# Patient Record
Sex: Male | Born: 1985 | Race: Black or African American | Hispanic: No | Marital: Single | Smoking: Never smoker
Health system: Southern US, Community
[De-identification: ages and names within clinical notes are randomized; demographics above are authoritative.]

## PROBLEM LIST (undated history)

## (undated) HISTORY — PX: OTHER SURGICAL HISTORY: SHX169

---

## 2004-11-15 ENCOUNTER — Emergency Department: Payer: Self-pay | Admitting: Emergency Medicine

## 2007-02-21 ENCOUNTER — Emergency Department: Payer: Self-pay | Admitting: Emergency Medicine

## 2007-12-23 ENCOUNTER — Emergency Department: Payer: Self-pay | Admitting: Internal Medicine

## 2009-08-21 ENCOUNTER — Emergency Department: Payer: Self-pay | Admitting: Unknown Physician Specialty

## 2009-08-29 ENCOUNTER — Emergency Department: Payer: Self-pay | Admitting: Emergency Medicine

## 2016-07-09 ENCOUNTER — Ambulatory Visit (INDEPENDENT_AMBULATORY_CARE_PROVIDER_SITE_OTHER): Payer: 59

## 2016-07-09 ENCOUNTER — Ambulatory Visit
Admission: EM | Admit: 2016-07-09 | Discharge: 2016-07-09 | Disposition: A | Discharge status: Home / Self Care | Payer: 59 | Attending: Family Medicine | Admitting: Family Medicine

## 2016-07-09 ENCOUNTER — Encounter: Payer: Self-pay | Admitting: Gynecology

## 2016-07-09 DIAGNOSIS — S86012A Strain of left Achilles tendon, initial encounter: Secondary | ICD-10-CM | POA: Diagnosis not present

## 2016-07-09 MED ORDER — MELOXICAM 15 MG PO TABS: 15.0000 mg | ORAL_TABLET | Freq: Every day | ORAL | 0 refills | Status: DC

## 2016-07-09 NOTE — ED Provider Notes (Signed)
[facility]    CSN: [id number] Arrival date & time: 07/09/16  0805     History   Chief Complaint Chief Complaint  Patient presents with  . Ankle Pain    HPI Peter Arias is a 30 y.o. male.   Patient reports working out in the gym yesterday boxing. States the gym floor was an even a padded and he felt Compazine back. Since then he's had pain in his left ankle. He was worse yesterday today's axial not as bad. No previous injury to the ankle or joint injury. He denies being on any medication or medical problems. No previous surgeries. He does not smoke. No known drug allergies. No pertinent family medical history pertaining to today's visit.   The history is provided by the patient. No language interpreter was used.  Ankle Pain  Location:  Ankle Injury: yes   Mechanism of injury comment:  M9ovement injury Ankle location:  L ankle Pain details:    Quality:  Aching, burning, pressure and shooting   Radiates to:  Does not radiate   Severity:  Moderate   Onset quality:  Sudden   Duration:  1 day   Timing:  Constant   Progression:  Worsening Chronicity:  New Dislocation: no   Foreign body present:  No foreign bodies Prior injury to area:  No Relieved by:  Nothing Worsened by:  Bearing weight and activity Ineffective treatments:  None tried Associated symptoms: swelling and tingling   Risk factors: no concern for non-accidental trauma, no frequent fractures, no known bone disorder, no obesity and no recent illness     History reviewed. No pertinent past medical history.  There are no active problems to display for this patient.   History reviewed. No pertinent surgical history.     Home Medications    Prior to Admission medications   Medication Sig Start Date End Date Taking? Authorizing Provider  meloxicam (MOBIC) 15 MG tablet Take 1 tablet (15 mg total) by mouth daily. 07/09/16   Hassan RowanEugene Tyrina Hines, MD    Family History No family history on  file.  Social History Social History  Substance Use Topics  . Smoking status: Never Smoker  . Smokeless tobacco: Never Used  . Alcohol use Yes     Allergies   Patient has no known allergies.   Review of Systems Review of Systems  Musculoskeletal: Positive for arthralgias, joint swelling and myalgias.  All other systems reviewed and are negative.    Physical Exam Triage Vital Signs ED Triage Vitals  Enc Vitals Group     BP 07/09/16 0831 (!) 158/86     Pulse Rate 07/09/16 0831 68     Resp --      Temp 07/09/16 0831 97 F (36.1 C)     Temp Source 07/09/16 0831 Tympanic     SpO2 07/09/16 0831 100 %     Weight 07/09/16 0832 170 lb (77.1 kg)     Height 07/09/16 0831 1' (0.305 m)     Head Circumference --      Peak Flow --      Pain Score 07/09/16 0833 5     Pain Loc --      Pain Edu? --      Excl. in GC? --    No data found.   Updated Vital Signs BP (!) 158/86 (BP Location: Left Arm)   Pulse 68   Temp 97 F (36.1 C) (Tympanic)   Ht 6\' 1"  (1.854 m)  Wt 170 lb (77.1 kg)   SpO2 100%   BMI 22.43 kg/m   Visual Acuity Right Eye Distance:   Left Eye Distance:   Bilateral Distance:    Right Eye Near:   Left Eye Near:    Bilateral Near:     Physical Exam  Constitutional: He is oriented to person, place, and time. He appears well-developed and well-nourished.  HENT:  Head: Normocephalic and atraumatic.  Eyes: Pupils are equal, round, and reactive to light.  Neck: Normal range of motion.  Musculoskeletal: He exhibits tenderness. He exhibits no edema.       Left foot: There is tenderness. There is normal range of motion.       Feet:  Pulses intact there is tenderness over the Achilles tendon consistent with a partial Achilles tendon tear.  Neurological: He is alert and oriented to person, place, and time.  Skin: Skin is warm.  Psychiatric: He has a normal mood and affect.  Vitals reviewed.    UC Treatments / Results  Labs (all labs ordered are  listed, but only abnormal results are displayed) Labs Reviewed - No data to display  EKG  EKG Interpretation None       Radiology Dg Ankle Complete Left  Result Date: 07/09/2016 CLINICAL DATA:  Posterior left ankle pain.  Leg buckled yesterday EXAM: LEFT ANKLE COMPLETE - 3+ VIEW COMPARISON:  None. FINDINGS: There is no evidence of fracture, dislocation, or joint effusion. There is no evidence of arthropathy or other focal bone abnormality. Soft tissues are unremarkable. IMPRESSION: Negative. Electronically Signed   By: Charlett NoseKevin  Dover M.D.   On: 07/09/2016 09:47    Procedures Procedures (including critical care time)  Medications Ordered in UC Medications - No data to display   Initial Impression / Assessment and Plan / UC Course  I have reviewed the triage vital signs and the nursing notes.  Pertinent labs & imaging results that were available during my care of the patient were reviewed by me and considered in my medical decision making (see chart for details).  Clinical Course    Explained to the patient on a Sunday afternoon is not much we can do as far as diagnostic study. Obtain x-ray just to make sure this no fracture but am strongly suggesting referral to orthopedic for reevaluation ASAP slowly can assess how much damage was done to the Achilles tendon. Also explained to him that MRI may be needed to ascertain how much damage was done as well. I strongly suggest that he try to see orthopedic on Monday and that we'll give him information for orthopedic office but if there is trouble getting into the orthopedic office on Monday he can call us back and we'll try to schedule appointment with somebody very soon. In the meantime we'll place him in a Cam Walker Mobic 15 mg 1 tablet a day and work note for Monday and Tuesday. Final Clinical Impressions(s) / UC Diagnoses   Final diagnoses:  Partial Achilles tendon tear, left, initial encounter    New Prescriptions Discharge  Medication List as of 07/09/2016  9:32 AM    START taking these medications   Details  meloxicam (MOBIC) 15 MG tablet Take 1 tablet (15 mg total) by mouth daily., Starting Sun 07/09/2016, Normal         Note: This dictation was prepared with Dragon dictation along with smaller phrase technology. Any transcriptional errors that result from this process are unintentional.   Hassan RowanEugene Jernee Murtaugh, MD 07/09/16 1009

## 2016-07-09 NOTE — ED Triage Notes (Signed)
Patient stated while at gym x yesterday injury left ankle.

## 2016-07-09 NOTE — ED Triage Notes (Signed)
Cam boot applied to left leg. Instruction given to patient

## 2016-07-19 ENCOUNTER — Encounter: Payer: Self-pay | Admitting: *Deleted

## 2016-07-19 ENCOUNTER — Ambulatory Visit
Admission: EM | Admit: 2016-07-19 | Discharge: 2016-07-19 | Disposition: A | Discharge status: Home / Self Care | Payer: 59 | Attending: Family Medicine | Admitting: Family Medicine

## 2016-07-19 DIAGNOSIS — S86002S Unspecified injury of left Achilles tendon, sequela: Secondary | ICD-10-CM | POA: Diagnosis not present

## 2016-07-19 MED ORDER — HYDROCODONE-ACETAMINOPHEN 7.5-300 MG PO TABS: 1.0000 | ORAL_TABLET | Freq: Three times a day (TID) | ORAL | 0 refills | Status: DC | PRN

## 2016-07-19 NOTE — ED Triage Notes (Signed)
Pt seen here on 11/19 and dx with partial tear of left achilles tendon. Returns today as left foot is swollen and painful. Has MRI scheduled for Friday.

## 2016-07-19 NOTE — ED Provider Notes (Signed)
[facility]    CSN: [id number] Arrival date & time: 07/19/16  [id number]     History   Chief Complaint Chief Complaint  Patient presents with  . Follow-up    HPI Peter Arias is a 30 y.o. male.   Peter Arias is a 30 year old black male who was seen on the 19th of this month and diagnosed with a partial Achilles tendon tear referred to the orthopedic of his choice. He states that he decided to see a sports specialist but could not see the person for 10 days from the time that he was seen here. He states that they assured him that they can get a MRI on the same day which struck me as being unusual he states that he was doing fine until about 2 days ago where he started having increased swelling of his left foot. He does have a Cam Walker on. He is going to work and states the last 2 days especially last night he's walked excessively at [employer]. He states that the meloxicam is longer controlling his pain he wants something stronger for his pain and he wants a note restricting his activity at lab [employer]. I offered to take him out of work for the next 2 days until he sees the orthopedic specialist on Friday but he states that all he needs is a note from me restricting his activity. They see previous notes for past medical history essentially unremarkable.   The history is provided by the patient. No language interpreter was used.    History reviewed. No pertinent past medical history.  There are no active problems to display for this patient.   History reviewed. No pertinent surgical history.     Home Medications    Prior to Admission medications   Medication Sig Start Date End Date Taking? Authorizing Provider  meloxicam (MOBIC) 15 MG tablet Take 1 tablet (15 mg total) by mouth daily. 07/09/16  Yes Hassan RowanEugene Rudolph Daoust, MD  Hydrocodone-Acetaminophen 7.5-300 MG TABS Take 1 tablet by mouth 3 (three) times daily as needed. 07/19/16   Hassan RowanEugene Joevanni Roddey, MD    Family History History reviewed.  No pertinent family history.  Social History Social History  Substance Use Topics  . Smoking status: Never Smoker  . Smokeless tobacco: Never Used  . Alcohol use No     Allergies   Patient has no known allergies.   Review of Systems Review of Systems  Musculoskeletal: Positive for gait problem and myalgias.  All other systems reviewed and are negative.    Physical Exam Triage Vital Signs ED Triage Vitals  Enc Vitals Group     BP 07/19/16 0852 133/77     Pulse Rate 07/19/16 0852 68     Resp 07/19/16 0852 16     Temp 07/19/16 0852 97.5 F (36.4 C)     Temp Source 07/19/16 0852 Oral     SpO2 07/19/16 0852 100 %     Weight 07/19/16 0854 170 lb (77.1 kg)     Height 07/19/16 0854 6\' 1"  (1.854 m)     Head Circumference --      Peak Flow --      Pain Score 07/19/16 1014 5     Pain Loc --      Pain Edu? --      Excl. in GC? --    No data found.   Updated Vital Signs BP 133/77 (BP Location: Left Arm)   Pulse 68   Temp 97.5 F (  36.4 C) (Oral)   Resp 16   Ht 6\' 1"  (1.854 m)   Wt 170 lb (77.1 kg)   SpO2 100%   BMI 22.43 kg/m   Visual Acuity Right Eye Distance:   Left Eye Distance:   Bilateral Distance:    Right Eye Near:   Left Eye Near:    Bilateral Near:     Physical Exam  Constitutional: He is oriented to person, place, and time. He appears well-developed and well-nourished.  HENT:  Head: Normocephalic.  Eyes: Pupils are equal, round, and reactive to light.  Neck: Normal range of motion.  Pulmonary/Chest: Effort normal.  Musculoskeletal: He exhibits edema and tenderness.       Left foot: There is tenderness and swelling.       Feet:  Patient deaf and has more swelling over the foot and tenderness over the left ankle. He has more ecchymosis as well than he did on the 19th.  Neurological: He is alert and oriented to person, place, and time.  Skin: Skin is warm.     UC Treatments / Results  Labs (all labs ordered are listed, but only abnormal  results are displayed) Labs Reviewed - No data to display  EKG  EKG Interpretation None       Radiology No results found.  Procedures Procedures (including critical care time)  Medications Ordered in UC Medications - No data to display   Initial Impression / Assessment and Plan / UC Course  I have reviewed the triage vital signs and the nursing notes.  Pertinent labs & imaging results that were available during my care of the patient were reviewed by me and considered in my medical decision making (see chart for details).  Clinical Course     Several concerns. Through one incision orthopedically for 10 days since she has an appointment in 2 days with much we can do at this time. If they can get the MRI of the same day great but am somewhat doubtful. Other concern that I have is that most places as explained to him if injury is not done at work not going to accept restrictions. They're going to insist that either he is able to go to work or not go to work. He insisted they will honor of restricted note and I'll recommend that he can sit for most of his shift. For the pain medication he was looked up the drug reporting site and no consistent use of Vicodin but he has used 7.5 for the past and will give him basically 5 days worth #15 one every 8 hours and I recommend icing the foot as much as possible elevate the foot as staying off the foot as much as possible until he sees the orthopedic.  Final Clinical Impressions(s) / UC Diagnoses   Final diagnoses:  Achilles tendon injury, left, sequela    New Prescriptions Discharge Medication List as of 07/19/2016  9:53 AM    START taking these medications   Details  Hydrocodone-Acetaminophen 7.5-300 MG TABS Take 1 tablet by mouth 3 (three) times daily as needed., Starting Wed 07/19/2016, Normal        Given to him as well.  Note: This dictation was prepared with Dragon dictation along with smaller phrase technology. Any  transcriptional errors that result from this process are unintentional.   Hassan RowanEugene Aiyah Scarpelli, MD 07/19/16 1047

## 2016-08-10 ENCOUNTER — Ambulatory Visit: Payer: 59

## 2017-10-27 ENCOUNTER — Ambulatory Visit
Admission: EM | Admit: 2017-10-27 | Discharge: 2017-10-27 | Disposition: A | Discharge status: Home / Self Care | Payer: 59 | Attending: Family Medicine | Admitting: Family Medicine

## 2017-10-27 DIAGNOSIS — S0081XA Abrasion of other part of head, initial encounter: Secondary | ICD-10-CM

## 2017-10-27 DIAGNOSIS — M25562 Pain in left knee: Secondary | ICD-10-CM

## 2017-10-27 DIAGNOSIS — M25561 Pain in right knee: Secondary | ICD-10-CM

## 2017-10-27 MED ORDER — IBUPROFEN 800 MG PO TABS: 800.0000 mg | ORAL_TABLET | Freq: Three times a day (TID) | ORAL | 0 refills | Status: DC | PRN
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
## 2017-10-27 NOTE — Discharge Instructions (Signed)
Take it easy and rest.  Ibuprofen as directed.  You're going to be fine.  Take care  Dr. Adriana Simasook

## 2017-10-30 ENCOUNTER — Encounter: Payer: Self-pay | Admitting: Nurse Practitioner

## 2017-12-21 ENCOUNTER — Encounter: Payer: Self-pay | Admitting: Nurse Practitioner

## 2017-12-21 ENCOUNTER — Ambulatory Visit: Payer: BLUE CROSS/BLUE SHIELD | Admitting: Nurse Practitioner

## 2017-12-21 VITALS — BP 126/81 | HR 64 | Resp 16 | Ht 73.0 in | Wt 164.0 lb

## 2017-12-21 DIAGNOSIS — R300 Dysuria: Secondary | ICD-10-CM

## 2017-12-21 DIAGNOSIS — Z0001 Encounter for general adult medical examination with abnormal findings: Secondary | ICD-10-CM | POA: Diagnosis not present

## 2017-12-21 DIAGNOSIS — Z0000 Encounter for general adult medical examination without abnormal findings: Secondary | ICD-10-CM

## 2017-12-21 NOTE — Addendum Note (Signed)
Addended by: Loura Back on: 12/21/2017 04:38 PM   Modules accepted: Orders

## 2017-12-21 NOTE — Addendum Note (Signed)
Addended by: Golda Acre on: 12/21/2017 04:35 PM   Modules accepted: Orders

## 2017-12-21 NOTE — Progress Notes (Signed)
[facility] [address] [place], [place] [address]  Internal MEDICINE  Office Visit Note  Patient Name: Peter Arias  [id number]  [id number]  Date of Service: 12/21/2017  Chief Complaint  Patient presents with  . Annual Exam     The patient is here for routine wellness visit. He has no concerns or complaints today.     Current Medication: Outpatient Encounter Medications as of 12/21/2017  Medication Sig  . [DISCONTINUED] ibuprofen (ADVIL,MOTRIN) 800 MG tablet Take 1 tablet (800 mg total) by mouth every 8 (eight) hours as needed. (Patient not taking: Reported on 12/21/2017)   No facility-administered encounter medications on file as of 12/21/2017.     Surgical History: History reviewed. No pertinent surgical history.  Medical History: History reviewed. No pertinent past medical history.  Family History: History reviewed. No pertinent family history.    Review of Systems  Constitutional: Negative for activity change, chills, fatigue and unexpected weight change.  HENT: Negative for congestion, postnasal drip, rhinorrhea, sneezing and sore throat.   Eyes: Negative.  Negative for redness.  Respiratory: Negative for cough, chest tightness, shortness of breath and wheezing.   Cardiovascular: Negative for chest pain and palpitations.  Gastrointestinal: Negative for abdominal pain, blood in stool, constipation, diarrhea, nausea and vomiting.  Endocrine: Negative for cold intolerance, heat intolerance, polydipsia, polyphagia and polyuria.  Genitourinary: Negative for dysuria, flank pain, frequency and testicular pain.  Musculoskeletal: Negative for arthralgias, back pain, joint swelling, myalgias and neck pain.  Skin: Negative for rash.  Allergic/Immunologic: Negative for environmental allergies.  Neurological: Negative for dizziness, tremors, numbness and headaches.  Hematological: Negative for adenopathy. Does not bruise/bleed easily.  Psychiatric/Behavioral:  Negative for agitation, behavioral problems (Depression), sleep disturbance and suicidal ideas. The patient is not nervous/anxious.      Today's Vitals   12/21/17 0852  BP: 126/81  Pulse: 64  Resp: 16  SpO2: 99%  Weight: 164 lb (74.4 kg)  Height:  (1.854 m)    Physical Exam  Constitutional: He is oriented to person, place, and time. He appears well-developed and well-nourished. No distress.  HENT:  Head: Normocephalic and atraumatic.  Mouth/Throat: Oropharynx is clear and moist. No oropharyngeal exudate.  Eyes: Pupils are equal, round, and reactive to light. Conjunctivae and EOM are normal.  Neck: Normal range of motion. Neck supple. No JVD present. No tracheal deviation present. No thyromegaly present.  Cardiovascular: Normal rate, regular rhythm, normal heart sounds and intact distal pulses. Exam reveals no gallop and no friction rub.  No murmur heard. Pulmonary/Chest: Effort normal and breath sounds normal. No respiratory distress. He has no wheezes. He has no rales. He exhibits no tenderness.  Abdominal: Soft. Bowel sounds are normal. There is no tenderness.  Musculoskeletal: Normal range of motion.  Lymphadenopathy:    He has no cervical adenopathy.  Neurological: He is alert and oriented to person, place, and time. No cranial nerve deficit.  Skin: Skin is warm and dry. Capillary refill takes less than 2 seconds. He is not diaphoretic.  Psychiatric: He has a normal mood and affect. His behavior is normal. Judgment and thought content normal.  Nursing note and vitals reviewed.  Assessment/Plan: 1. Encounter for general adult medical examination with abnormal findings Annual wellness visit. Routine, fasting labs ordered.  - CBC with Differential/Platelet - Comprehensive metabolic panel - Lipid panel - TSH  2. Laboratory exam ordered as part of routine general medical examination - Urinalysis, Routine w reflex microscopic Routine, fasting labs also ordered.  General Counseling: Gabriell verbalizes understanding of the findings of todays visit and agrees with plan of treatment. I have discussed any further diagnostic evaluation that may be needed or ordered today. We also reviewed his medications today. he has been encouraged to call the office with any questions or concerns that should arise related to todays visit.  Orders Placed This Encounter  Procedures  . Urinalysis, Routine w reflex microscopic     Time spent: 66 Minutes   Lyndon Code, MD  Internal Medicine

## 2017-12-22 LAB — URINALYSIS, ROUTINE W REFLEX MICROSCOPIC
BILIRUBIN UA: NEGATIVE
Glucose, UA: NEGATIVE
Ketones, UA: NEGATIVE
LEUKOCYTES UA: NEGATIVE
Nitrite, UA: NEGATIVE
PROTEIN UA: NEGATIVE
RBC, UA: NEGATIVE
Specific Gravity, UA: 1.027 (ref 1.005–1.030)
Urobilinogen, Ur: 0.2 mg/dL (ref 0.2–1.0)
pH, UA: 5 (ref 5.0–7.5)

## 2017-12-26 DIAGNOSIS — Z0001 Encounter for general adult medical examination with abnormal findings: Secondary | ICD-10-CM | POA: Diagnosis not present

## 2017-12-27 LAB — COMPREHENSIVE METABOLIC PANEL
ALK PHOS: 38 IU/L — AB (ref 39–117)
ALT: 13 IU/L (ref 0–44)
AST: 23 IU/L (ref 0–40)
Albumin/Globulin Ratio: 1.7 (ref 1.2–2.2)
Albumin: 4.8 g/dL (ref 3.5–5.5)
BUN / CREAT RATIO: 15 (ref 9–20)
BUN: 19 mg/dL (ref 6–20)
CO2: 27 mmol/L (ref 20–29)
CREATININE: 1.31 mg/dL — AB (ref 0.76–1.27)
Calcium: 9.8 mg/dL (ref 8.7–10.2)
Chloride: 98 mmol/L (ref 96–106)
GFR calc Af Amer: 83 mL/min/{1.73_m2} (ref >59)
GFR calc non Af Amer: 72 mL/min/{1.73_m2} (ref >59)
GLUCOSE: 101 mg/dL — AB (ref 65–99)
Globulin, Total: 2.8 g/dL (ref 1.5–4.5)
Potassium: 4.7 mmol/L (ref 3.5–5.2)
Sodium: 137 mmol/L (ref 134–144)
Total Protein: 7.6 g/dL (ref 6.0–8.5)

## 2017-12-27 LAB — CBC WITH DIFFERENTIAL/PLATELET
BASOS ABS: 0 10*3/uL (ref 0.0–0.2)
Basos: 1 %
EOS (ABSOLUTE): 0 10*3/uL (ref 0.0–0.4)
Eos: 1 %
HEMOGLOBIN: 13.6 g/dL (ref 13.0–17.7)
Hematocrit: 40.1 % (ref 37.5–51.0)
Immature Grans (Abs): 0 10*3/uL (ref 0.0–0.1)
Immature Granulocytes: 0 %
LYMPHS ABS: 1.4 10*3/uL (ref 0.7–3.1)
Lymphs: 31 %
MCH: 28.1 pg (ref 26.6–33.0)
MCHC: 33.9 g/dL (ref 31.5–35.7)
MCV: 83 fL (ref 79–97)
MONOCYTES: 9 %
MONOS ABS: 0.4 10*3/uL (ref 0.1–0.9)
NEUTROS ABS: 2.6 10*3/uL (ref 1.4–7.0)
Neutrophils: 58 %
PLATELETS: 277 10*3/uL (ref 150–379)
RBC: 4.84 x10E6/uL (ref 4.14–5.80)
RDW: 13.9 % (ref 12.3–15.4)
WBC: 4.5 10*3/uL (ref 3.4–10.8)

## 2017-12-27 LAB — TSH: TSH: 1.82 u[IU]/mL (ref 0.450–4.500)

## 2017-12-27 LAB — LIPID PANEL
CHOLESTEROL TOTAL: 157 mg/dL (ref 100–199)
Chol/HDL Ratio: 3.7 ratio (ref 0.0–5.0)
HDL: 43 mg/dL (ref >39)
LDL Calculated: 94 mg/dL (ref 0–99)
TRIGLYCERIDES: 99 mg/dL (ref 0–149)
VLDL CHOLESTEROL CAL: 20 mg/dL (ref 5–40)

## 2018-01-17 ENCOUNTER — Other Ambulatory Visit: Payer: Self-pay

## 2018-01-17 ENCOUNTER — Ambulatory Visit
Admission: EM | Admit: 2018-01-17 | Discharge: 2018-01-17 | Disposition: A | Discharge status: Home / Self Care | Payer: BLUE CROSS/BLUE SHIELD | Attending: Emergency Medicine | Admitting: Emergency Medicine

## 2018-01-17 DIAGNOSIS — J029 Acute pharyngitis, unspecified: Secondary | ICD-10-CM | POA: Diagnosis not present

## 2018-01-17 DIAGNOSIS — J358 Other chronic diseases of tonsils and adenoids: Secondary | ICD-10-CM | POA: Diagnosis not present

## 2018-01-17 LAB — RAPID STREP SCREEN (MED CTR MEBANE ONLY): Streptococcus, Group A Screen (Direct): NEGATIVE

## 2018-01-17 NOTE — ED Triage Notes (Signed)
Pt with sore throat x past several days and reports white spots on tonsils. Pain 3/10 and denies other sx

## 2018-01-17 NOTE — ED Provider Notes (Signed)
[facility]    CSN: [id number] Arrival date & time: 01/17/18  0802     History   Chief Complaint Chief Complaint  Patient presents with  . Sore Throat    HPI Peter Arias is a 32 y.o. male.   HPI  32 year old male presents with a sore throat that he has had for the past several days.  Morning he looked in his throat and noticed white spots on his tonsil.  There are very discrete and small.  He had a sore throat about a month ago that resolved without any sequelae.  He states today's pain is 3 out of 10 and has no other symptoms.  He specifically denies nasal congestion fever chills or coughing.       History reviewed. No pertinent past medical history.  Patient Active Problem List   Diagnosis Date Noted  . Laboratory exam ordered as part of routine general medical examination 12/21/2017    Past Surgical History:  Procedure Laterality Date  . achilles tendon rupture         Home Medications    Prior to Admission medications   Not on File    Family History Family History  Problem Relation Age of Onset  . Heart failure Father     Social History Social History   Tobacco Use  . Smoking status: Former Games developer  . Smokeless tobacco: Never Used  Substance Use Topics  . Alcohol use: Yes    Comment: social  . Drug use: No     Allergies   Patient has no known allergies.   Review of Systems Review of Systems  Constitutional: Positive for activity change. Negative for chills, fatigue and fever.  HENT: Positive for sore throat.   All other systems reviewed and are negative.    Physical Exam Triage Vital Signs ED Triage Vitals  Enc Vitals Group     BP 01/17/18 0822 140/79     Pulse Rate 01/17/18 0822 67     Resp 01/17/18 0822 16     Temp 01/17/18 0822 98.2 F (36.8 C)     Temp Source 01/17/18 0822 Oral     SpO2 01/17/18 0822 100 %     Weight 01/17/18 0820 170 lb (77.1 kg)     Height 01/17/18 0820  (1.854 m)     Head  Circumference --      Peak Flow --      Pain Score 01/17/18 0820 3     Pain Loc --      Pain Edu? --      Excl. in GC? --    No data found.  Updated Vital Signs BP 140/79 (BP Location: Left Arm)   Pulse 67   Temp 98.2 F (36.8 C) (Oral)   Resp 16   Ht  (1.854 m)   Wt 170 lb (77.1 kg)   SpO2 100%   BMI 22.43 kg/m   Visual Acuity Right Eye Distance:   Left Eye Distance:   Bilateral Distance:    Right Eye Near:   Left Eye Near:    Bilateral Near:     Physical Exam  Constitutional: He is oriented to person, place, and time. He appears well-developed and well-nourished.  Non-toxic appearance. He does not appear ill. No distress.  HENT:  Head: Normocephalic.  Right Ear: Hearing normal. No drainage, swelling or tenderness.  Left Ear: Hearing normal. No drainage, swelling or tenderness.  Mouth/Throat: Oropharynx is clear and moist and  mucous membranes are normal. No uvula swelling. No oropharyngeal exudate, posterior oropharyngeal edema, posterior oropharyngeal erythema or tonsillar abscesses. No tonsillar exudate.  Right TM is occluded.  Left TM is dull.  Patient has 2 small tonsil stones in the left and right tonsillar crypt  Eyes: Pupils are equal, round, and reactive to light.  Neck: Normal range of motion.  Pulmonary/Chest: Effort normal and breath sounds normal.  Neurological: He is alert and oriented to person, place, and time.  Skin: Skin is warm and dry.  Psychiatric: He has a normal mood and affect. His behavior is normal.  Nursing note and vitals reviewed.    UC Treatments / Results  Labs (all labs ordered are listed, but only abnormal results are displayed) Labs Reviewed  RAPID STREP SCREEN ([facility] ONLY)  CULTURE, GROUP A STREP [facility])    EKG None  Radiology No results found.  Procedures Procedures (including critical care time)  Medications Ordered in UC Medications - No data to display  Initial Impression / Assessment and Plan / UC  Course  I have reviewed the triage vital signs and the nursing notes.  Pertinent labs & imaging results that were available during my care of the patient were reviewed by me and considered in my medical decision making (see chart for details).     Plan: 1. Test/x-ray results and diagnosis reviewed with patient 2. rx as per orders; risks, benefits, potential side effects reviewed with patient 3. Recommend supportive treatment with salt water gargles or lozenges as necessary for comfort.  Self cleaning of the tonsil stones.  He may look this information up online.  Cultures and sensitivities will be available in 48 hours.  I told him this is likely a viral illness does not require antibiotics. 4. F/u prn if symptoms worsen or don't improve  Final Clinical Impressions(s) / UC Diagnoses   Final diagnoses:  Sore throat  Tonsillith   Discharge Instructions   None    ED Prescriptions    None     Controlled Substance Prescriptions [place] Controlled Substance Registry consulted? Not Applicable   Lutricia Feil, PA-C 01/17/18 [id number]

## 2018-01-19 LAB — CULTURE, GROUP A STREP (THRC)

## 2018-03-01 ENCOUNTER — Other Ambulatory Visit: Payer: Self-pay

## 2018-03-01 ENCOUNTER — Ambulatory Visit
Admission: EM | Admit: 2018-03-01 | Discharge: 2018-03-01 | Disposition: A | Discharge status: Home / Self Care | Payer: BLUE CROSS/BLUE SHIELD | Attending: Family Medicine | Admitting: Family Medicine

## 2018-03-01 ENCOUNTER — Encounter: Payer: Self-pay | Admitting: Emergency Medicine

## 2018-03-01 ENCOUNTER — Ambulatory Visit (INDEPENDENT_AMBULATORY_CARE_PROVIDER_SITE_OTHER): Payer: BLUE CROSS/BLUE SHIELD

## 2018-03-01 DIAGNOSIS — S6391XA Sprain of unspecified part of right wrist and hand, initial encounter: Secondary | ICD-10-CM | POA: Diagnosis not present

## 2018-03-01 DIAGNOSIS — M79641 Pain in right hand: Secondary | ICD-10-CM | POA: Diagnosis not present

## 2018-03-01 DIAGNOSIS — M7989 Other specified soft tissue disorders: Secondary | ICD-10-CM | POA: Diagnosis not present

## 2018-03-01 NOTE — ED Triage Notes (Signed)
Pt here today c/o right hand pain and swelling. symptoms are intermittent. Started a couple months ago but gotten worse recently.  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

## 2018-03-01 NOTE — ED Provider Notes (Signed)
[facility]    CSN: [id number] Arrival date & time: 03/01/18  0813     History   Chief Complaint Chief Complaint  Patient presents with  . Hand Pain    right    HPI Peter Arias is a 32 y.o. male.   32 yo male with a c/o intermittent right hand pain and swelling for the past 2 months. 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 Denies any fevers or chills.   The history is provided by the patient.  Hand Pain     History reviewed. No pertinent past medical history.  Patient Active Problem List   Diagnosis Date Noted  . Laboratory exam ordered as part of routine general medical examination 12/21/2017    Past Surgical History:  Procedure Laterality Date  . achilles tendon rupture         Home Medications    Prior to Admission medications   Not on File    Family History Family History  Problem Relation Age of Onset  . Heart failure Father     Social History Social History   Tobacco Use  . Smoking status: Former Games developer  . Smokeless tobacco: Never Used  Substance Use Topics  . Alcohol use: Yes    Comment: social  . Drug use: No     Allergies   Patient has no known allergies.   Review of Systems Review of Systems   Physical Exam Triage Vital Signs ED Triage Vitals  Enc Vitals Group     BP 03/01/18 0834 123/80     Pulse Rate 03/01/18 0834 60     Resp 03/01/18 0834 17     Temp 03/01/18 0834 98.3 F (36.8 C)     Temp Source 03/01/18 0834 Oral     SpO2 03/01/18 0834 100 %     Weight 03/01/18 0834 170 lb (77.1 kg)     Height 03/01/18 0834 6\' 1"  (1.854 m)     Head Circumference --      Peak Flow --      Pain Score 03/01/18 0832 4     Pain Loc --      Pain Edu? --      Excl. in GC? --    No data found.  Updated Vital Signs BP 123/80 (BP Location: Left Arm)   Pulse 60   Temp 98.3 F (36.8 C) (Oral)   Resp 17   Ht 6\' 1"  (1.854 m)   Wt 170 lb (77.1 kg)   SpO2 100%    BMI 22.43 kg/m   Visual Acuity Right Eye Distance:   Left Eye Distance:   Bilateral Distance:    Right Eye Near:   Left Eye Near:    Bilateral Near:     Physical Exam  Constitutional: He appears well-developed and well-nourished. No distress.  Musculoskeletal:       Right hand: He exhibits tenderness (mild), bony tenderness (mild; diffuse) and swelling (mild). He exhibits normal range of motion, normal two-point discrimination, normal capillary refill, no deformity and no laceration. Normal sensation noted. Normal strength noted.  Skin: He is not diaphoretic.  Nursing note and vitals reviewed.    UC Treatments / Results  Labs (all labs ordered are listed, but only abnormal results are displayed) Labs Reviewed - No data to display  EKG None  Radiology Dg Hand Complete Right  Result Date: 03/01/2018 CLINICAL DATA:  Pain and swelling. EXAM: RIGHT  HAND - COMPLETE 3+ VIEW COMPARISON:  None. FINDINGS: Frontal, oblique, and lateral views were obtained. No acute fracture or dislocation. Joint spaces appear normal. A small exostosis arises from the lateral proximal most aspect of the third proximal phalanx. No erosive change. IMPRESSION: No fracture or dislocation. No appreciable arthropathy. Small benign exostosis along the lateral aspect of the proximal most aspect of the third proximal phalanx. Electronically Signed   By: Bretta BangWilliam  Woodruff III M.D.   On: 03/01/2018 09:09    Procedures Procedures (including critical care time)  Medications Ordered in UC Medications - No data to display  Initial Impression / Assessment and Plan / UC Course  I have reviewed the triage vital signs and the nursing notes.  Pertinent labs & imaging results that were available during my care of the patient were reviewed by me and considered in my medical decision making (see chart for details).      Final Clinical Impressions(s) / UC Diagnoses   Final diagnoses:  Hand sprain, right, initial  encounter     Discharge Instructions     Rest, ice, over the counter ibuprofen (advil/motrin)     ED Prescriptions    None      1. x-ray result (negative) and diagnosis reviewed with patient 2. Recommend supportive treatment with rest, ice, elevation, otc analgesics 3. Follow-up prn if symptoms worsen or don't improve  Controlled Substance Prescriptions [place] Controlled Substance Registry consulted? Not Applicable   Payton Mccallumonty, Sasha Rueth, MD 03/01/18 1115

## 2018-03-01 NOTE — Discharge Instructions (Signed)
Rest, ice, over the counter ibuprofen (advil/motrin)

## 2018-12-24 ENCOUNTER — Encounter: Payer: Self-pay | Admitting: Nurse Practitioner

## 2018-12-30 ENCOUNTER — Other Ambulatory Visit: Payer: Self-pay

## 2018-12-30 ENCOUNTER — Ambulatory Visit (INDEPENDENT_AMBULATORY_CARE_PROVIDER_SITE_OTHER): Payer: BLUE CROSS/BLUE SHIELD | Admitting: Nurse Practitioner

## 2018-12-30 ENCOUNTER — Encounter: Payer: Self-pay | Admitting: Nurse Practitioner

## 2018-12-30 VITALS — BP 125/77 | HR 64 | Resp 16 | Ht 73.0 in | Wt 168.4 lb

## 2018-12-30 DIAGNOSIS — Z0000 Encounter for general adult medical examination without abnormal findings: Secondary | ICD-10-CM

## 2018-12-30 DIAGNOSIS — R300 Dysuria: Secondary | ICD-10-CM

## 2018-12-30 NOTE — Progress Notes (Signed)
[facility] [address] [place], [place] [address]  Internal MEDICINE  Office Visit Note  Patient Name: Peter CamaraJoey L Couillard  [id number] 02, 1987  [id number]  Date of Service: 01/15/2019   Pt is here for routine health maintenance examination  Chief Complaint  Patient presents with  . Annual Exam     The patient is here for annual health maintenance exam. He has n concerns or complaints to being up. He is due to have check of routine, asting labs today.     Current Medication: No outpatient encounter medications on file as of 12/30/2018.   No facility-administered encounter medications on file as of 12/30/2018.     Surgical History: Past Surgical History:  Procedure Laterality Date  . achilles tendon rupture      Medical History: History reviewed. No pertinent past medical history.  Family History: Family History  Problem Relation Age of Onset  . Heart failure Father       Review of Systems  Constitutional: Negative for activity change, chills, fatigue and unexpected weight change.  HENT: Negative for congestion, postnasal drip, rhinorrhea, sneezing and sore throat.   Respiratory: Negative for cough, chest tightness, shortness of breath and wheezing.   Cardiovascular: Negative for chest pain and palpitations.  Gastrointestinal: Negative for abdominal pain, blood in stool, constipation, diarrhea, nausea and vomiting.  Endocrine: Negative for cold intolerance, heat intolerance, polydipsia and polyuria.  Genitourinary: Negative for dysuria, flank pain, frequency and testicular pain.  Musculoskeletal: Negative for arthralgias, back pain, joint swelling, myalgias and neck pain.  Skin: Negative for rash.  Allergic/Immunologic: Negative for environmental allergies.  Neurological: Negative for dizziness, tremors, numbness and headaches.  Hematological: Negative for adenopathy. Does not bruise/bleed easily.  Psychiatric/Behavioral: Negative for agitation, behavioral  problems (Depression), sleep disturbance and suicidal ideas. The patient is not nervous/anxious.     Today's Vitals   12/30/18 1201  BP: 125/77  Pulse: 64  Resp: 16  SpO2: 99%  Weight: 168 lb 6.4 oz (76.4 kg)  Height: 6\' 1"  (1.854 m)   Body mass index is 22.22 kg/m.  Physical Exam Vitals signs and nursing note reviewed.  Constitutional:      General: He is not in acute distress.    Appearance: Normal appearance. He is well-developed. He is not diaphoretic.  HENT:     Head: Normocephalic and atraumatic.     Mouth/Throat:     Pharynx: No oropharyngeal exudate.  Eyes:     Conjunctiva/sclera: Conjunctivae normal.     Pupils: Pupils are equal, round, and reactive to light.  Neck:     Musculoskeletal: Normal range of motion and neck supple.     Thyroid: No thyromegaly.     Vascular: No JVD.     Trachea: No tracheal deviation.  Cardiovascular:     Rate and Rhythm: Normal rate and regular rhythm.     Pulses: Normal pulses.     Heart sounds: Normal heart sounds. No murmur. No friction rub. No gallop.   Pulmonary:     Effort: Pulmonary effort is normal. No respiratory distress.     Breath sounds: Normal breath sounds. No wheezing or rales.  Chest:     Chest wall: No tenderness.  Abdominal:     General: Bowel sounds are normal.     Palpations: Abdomen is soft.     Tenderness: There is no abdominal tenderness.  Musculoskeletal: Normal range of motion.  Skin:    General: Skin is warm and dry.     Capillary Refill:  Capillary refill takes less than 2 seconds.  Neurological:     Mental Status: He is alert and oriented to person, place, and time.     Cranial Nerves: No cranial nerve deficit.  Psychiatric:        Behavior: Behavior normal.        Thought Content: Thought content normal.        Judgment: Judgment normal.      LABS: Recent Results (from the past 2160 hour(s))  UA/M w/rflx Culture, Routine     Status: None   Collection Time: 12/30/18  4:22 PM  Result Value  Ref Range   Specific Gravity, UA 1.023 1.005 - 1.030   pH, UA 5.5 5.0 - 7.5   Color, UA Yellow Yellow   Appearance Ur Clear Clear   Leukocytes,UA Negative Negative   Protein,UA Negative Negative/Trace   Glucose, UA Negative Negative   Ketones, UA Negative Negative   RBC, UA Negative Negative   Bilirubin, UA Negative Negative   Urobilinogen, Ur 1.0 0.2 - 1.0 mg/dL   Nitrite, UA Negative Negative   Microscopic Examination Comment     Comment: Microscopic follows if indicated.   Microscopic Examination See below:     Comment: Microscopic was indicated and was performed.   Urinalysis Reflex Comment     Comment: This specimen will not reflex to a Urine Culture.  Microscopic Examination     Status: None   Collection Time: 12/30/18  4:22 PM  Result Value Ref Range   WBC, UA 0-5 0 - 5 /hpf   RBC 0-2 0 - 2 /hpf   Epithelial Cells (non renal) None seen 0 - 10 /hpf   Casts None seen None seen /lpf   Mucus, UA Present Not Estab.   Bacteria, UA None seen None seen/Few  Comprehensive metabolic panel     Status: Abnormal   Collection Time: 12/31/18  7:10 AM  Result Value Ref Range   Glucose 91 65 - 99 mg/dL   BUN 22 (H) 6 - 20 mg/dL   Creatinine, Ser 1.61 0.76 - 1.27 mg/dL   GFR calc non Af Amer 90 >59 mL/min/1.73   GFR calc Af Amer 104 >59 mL/min/1.73   BUN/Creatinine Ratio 20 9 - 20   Sodium 140 134 - 144 mmol/L   Potassium 4.1 3.5 - 5.2 mmol/L   Chloride 99 96 - 106 mmol/L   CO2 28 20 - 29 mmol/L   Calcium 10.0 8.7 - 10.2 mg/dL   Total Protein 7.2 6.0 - 8.5 g/dL   Albumin 4.9 4.0 - 5.0 g/dL   Globulin, Total 2.3 1.5 - 4.5 g/dL   Albumin/Globulin Ratio 2.1 1.2 - 2.2   Bilirubin Total 0.3 0.0 - 1.2 mg/dL   Alkaline Phosphatase 33 (L) 39 - 117 IU/L   AST 15 0 - 40 IU/L   ALT 10 0 - 44 IU/L  CBC     Status: None   Collection Time: 12/31/18  7:10 AM  Result Value Ref Range   WBC 5.3 3.4 - 10.8 x10E3/uL   RBC 4.62 4.14 - 5.80 x10E6/uL   Hemoglobin 13.2 13.0 - 17.7 g/dL    Hematocrit 09.6 04.5 - 51.0 %   MCV 84 79 - 97 fL   MCH 28.6 26.6 - 33.0 pg   MCHC 33.9 31.5 - 35.7 g/dL   RDW 40.9 81.1 - 91.4 %   Platelets 209 150 - 450 x10E3/uL  Lipid Panel w/o Chol/HDL Ratio     Status: Abnormal  Collection Time: 12/31/18  7:10 AM  Result Value Ref Range   Cholesterol, Total 188 100 - 199 mg/dL   Triglycerides 588 0 - 149 mg/dL   HDL 54 >32 mg/dL   VLDL Cholesterol Cal 26 5 - 40 mg/dL   LDL Calculated 549 (H) 0 - 99 mg/dL  T4, free     Status: None   Collection Time: 12/31/18  7:10 AM  Result Value Ref Range   Free T4 1.14 0.82 - 1.77 ng/dL  TSH     Status: None   Collection Time: 12/31/18  7:10 AM  Result Value Ref Range   TSH 3.110 0.450 - 4.500 uIU/mL   Assessment/Plan: 1. Routine general medical examination at a health care facility Annual health maintenance exam today. rotuine fasting labs ordered with lab slip given to patient.   2. Dysuria - UA/M w/rflx Culture, Routine  General Counseling: Tomio verbalizes understanding of the findings of todays visit and agrees with plan of treatment. I have discussed any further diagnostic evaluation that may be needed or ordered today. We also reviewed his medications today. he has been encouraged to call the office with any questions or concerns that should arise related to todays visit.    Counseling:  This patient was seen by Vincent Gros FNP Collaboration with Dr Lyndon Code as a part of collaborative care agreement  Orders Placed This Encounter  Procedures  . Microscopic Examination  . UA/M w/rflx Culture, Routine     Time spent: 83 Minutes      Lyndon Code, MD  Internal Medicine

## 2018-12-31 ENCOUNTER — Other Ambulatory Visit: Payer: Self-pay | Admitting: Nurse Practitioner

## 2018-12-31 DIAGNOSIS — R944 Abnormal results of kidney function studies: Secondary | ICD-10-CM | POA: Diagnosis not present

## 2018-12-31 DIAGNOSIS — Z0001 Encounter for general adult medical examination with abnormal findings: Secondary | ICD-10-CM | POA: Diagnosis not present

## 2018-12-31 LAB — UA/M W/RFLX CULTURE, ROUTINE
Bilirubin, UA: NEGATIVE
Glucose, UA: NEGATIVE
Ketones, UA: NEGATIVE
Leukocytes,UA: NEGATIVE
Nitrite, UA: NEGATIVE
Protein,UA: NEGATIVE
RBC, UA: NEGATIVE
Specific Gravity, UA: 1.023 (ref 1.005–1.030)
Urobilinogen, Ur: 1 mg/dL (ref 0.2–1.0)
pH, UA: 5.5 (ref 5.0–7.5)

## 2018-12-31 LAB — MICROSCOPIC EXAMINATION
Bacteria, UA: NONE SEEN
Casts: NONE SEEN /lpf
Epithelial Cells (non renal): NONE SEEN /hpf (ref 0–10)

## 2019-01-01 LAB — COMPREHENSIVE METABOLIC PANEL
ALT: 10 IU/L (ref 0–44)
AST: 15 IU/L (ref 0–40)
Albumin/Globulin Ratio: 2.1 (ref 1.2–2.2)
Albumin: 4.9 g/dL (ref 4.0–5.0)
Alkaline Phosphatase: 33 IU/L — ABNORMAL LOW (ref 39–117)
BUN/Creatinine Ratio: 20 (ref 9–20)
BUN: 22 mg/dL — ABNORMAL HIGH (ref 6–20)
Bilirubin Total: 0.3 mg/dL (ref 0.0–1.2)
CO2: 28 mmol/L (ref 20–29)
Calcium: 10 mg/dL (ref 8.7–10.2)
Chloride: 99 mmol/L (ref 96–106)
Creatinine, Ser: 1.08 mg/dL (ref 0.76–1.27)
GFR calc Af Amer: 104 mL/min/{1.73_m2} (ref >59)
GFR calc non Af Amer: 90 mL/min/{1.73_m2} (ref >59)
Globulin, Total: 2.3 g/dL (ref 1.5–4.5)
Glucose: 91 mg/dL (ref 65–99)
Potassium: 4.1 mmol/L (ref 3.5–5.2)
Sodium: 140 mmol/L (ref 134–144)
Total Protein: 7.2 g/dL (ref 6.0–8.5)

## 2019-01-01 LAB — CBC
Hematocrit: 38.9 % (ref 37.5–51.0)
Hemoglobin: 13.2 g/dL (ref 13.0–17.7)
MCH: 28.6 pg (ref 26.6–33.0)
MCHC: 33.9 g/dL (ref 31.5–35.7)
MCV: 84 fL (ref 79–97)
Platelets: 209 10*3/uL (ref 150–450)
RBC: 4.62 x10E6/uL (ref 4.14–5.80)
RDW: 13.2 % (ref 11.6–15.4)
WBC: 5.3 10*3/uL (ref 3.4–10.8)

## 2019-01-01 LAB — LIPID PANEL W/O CHOL/HDL RATIO
Cholesterol, Total: 188 mg/dL (ref 100–199)
HDL: 54 mg/dL (ref >39)
LDL Calculated: 108 mg/dL — ABNORMAL HIGH (ref 0–99)
Triglycerides: 128 mg/dL (ref 0–149)
VLDL Cholesterol Cal: 26 mg/dL (ref 5–40)

## 2019-01-01 LAB — T4, FREE: Free T4: 1.14 ng/dL (ref 0.82–1.77)

## 2019-01-01 LAB — TSH: TSH: 3.11 u[IU]/mL (ref 0.450–4.500)

## 2019-01-15 DIAGNOSIS — R300 Dysuria: Secondary | ICD-10-CM | POA: Insufficient documentation

## 2019-08-19 DIAGNOSIS — Z20828 Contact with and (suspected) exposure to other viral communicable diseases: Secondary | ICD-10-CM | POA: Diagnosis not present

## 2019-11-27 DIAGNOSIS — Z20828 Contact with and (suspected) exposure to other viral communicable diseases: Secondary | ICD-10-CM | POA: Diagnosis not present

## 2019-11-27 DIAGNOSIS — U071 COVID-19: Secondary | ICD-10-CM | POA: Diagnosis not present

## 2019-12-03 DIAGNOSIS — Z20828 Contact with and (suspected) exposure to other viral communicable diseases: Secondary | ICD-10-CM | POA: Diagnosis not present

## 2019-12-03 DIAGNOSIS — U071 COVID-19: Secondary | ICD-10-CM | POA: Diagnosis not present

## 2019-12-09 DIAGNOSIS — Z20828 Contact with and (suspected) exposure to other viral communicable diseases: Secondary | ICD-10-CM | POA: Diagnosis not present

## 2019-12-09 DIAGNOSIS — Z03818 Encounter for observation for suspected exposure to other biological agents ruled out: Secondary | ICD-10-CM | POA: Diagnosis not present

## 2019-12-30 ENCOUNTER — Telehealth: Payer: Self-pay

## 2019-12-30 NOTE — Telephone Encounter (Signed)
Confirmed and screened for 01-01-20 ov. 

## 2020-01-01 ENCOUNTER — Ambulatory Visit (INDEPENDENT_AMBULATORY_CARE_PROVIDER_SITE_OTHER): Payer: BC Managed Care – PPO | Admitting: Nurse Practitioner

## 2020-01-01 ENCOUNTER — Other Ambulatory Visit: Payer: Self-pay

## 2020-01-01 ENCOUNTER — Encounter: Payer: Self-pay | Admitting: Nurse Practitioner

## 2020-01-01 VITALS — BP 123/83 | HR 56 | Temp 97.5°F | Resp 16 | Ht 73.0 in | Wt 164.0 lb

## 2020-01-01 DIAGNOSIS — R300 Dysuria: Secondary | ICD-10-CM

## 2020-01-01 DIAGNOSIS — E782 Mixed hyperlipidemia: Secondary | ICD-10-CM

## 2020-01-01 DIAGNOSIS — Z0000 Encounter for general adult medical examination without abnormal findings: Secondary | ICD-10-CM | POA: Diagnosis not present

## 2020-01-01 NOTE — Progress Notes (Signed)
[facility] [name], [place] [address]  Internal MEDICINE  Office Visit Note  Patient Name: Peter Arias  [id number]  [id number]  Date of Service: 01/07/2020   Pt is here for routine health maintenance examination    Chief Complaint  Patient presents with  . Annual Exam     The patient is here for health maintenance exam. Denies concerns or complaints. He is due to have routine, fasting labs. Last year, LDL was mildly elevated. Heart rate is a little low, however, he is athletic. He is boxer and works out frequently.   Current Medication: No outpatient encounter medications on file as of 01/01/2020.   No facility-administered encounter medications on file as of 01/01/2020.    Surgical History: Past Surgical History:  Procedure Laterality Date  . achilles tendon rupture      Medical History: History reviewed. No pertinent past medical history.  Family History: Family History  Problem Relation Age of Onset  . Heart failure Father       Review of Systems  Constitutional: Negative for chills, fatigue and unexpected weight change.  HENT: Negative for congestion, postnasal drip, rhinorrhea, sneezing and sore throat.   Respiratory: Negative for cough, chest tightness, shortness of breath and wheezing.   Cardiovascular: Negative for chest pain and palpitations.  Gastrointestinal: Negative for abdominal pain, constipation, diarrhea, nausea and vomiting.  Endocrine: Negative for cold intolerance, heat intolerance, polydipsia and polyuria.  Genitourinary: Negative for dysuria, frequency, hematuria and urgency.  Musculoskeletal: Negative for arthralgias, back pain, joint swelling and neck pain.  Skin: Negative for rash.  Allergic/Immunologic: Negative for environmental allergies.  Neurological: Negative for dizziness, tremors, numbness and headaches.  Hematological: Negative for adenopathy. Does not bruise/bleed easily.  Psychiatric/Behavioral:  Negative for behavioral problems (Depression), sleep disturbance and suicidal ideas. The patient is not nervous/anxious.     Today's Vitals   01/01/20 0908  BP: 123/83  Pulse: (!) 56  Resp: 16  Temp: (!) 97.5 F (36.4 C)  SpO2: 100%  Weight: 164 lb (74.4 kg)  Height: 6\' 1"  (1.854 m)   Body mass index is 21.64 kg/m.  Physical Exam Vitals and nursing note reviewed.  Constitutional:      General: He is not in acute distress.    Appearance: Normal appearance. He is well-developed. He is not diaphoretic.  HENT:     Head: Normocephalic and atraumatic.     Nose: Nose normal.     Mouth/Throat:     Pharynx: No oropharyngeal exudate.  Eyes:     Conjunctiva/sclera: Conjunctivae normal.     Pupils: Pupils are equal, round, and reactive to light.  Neck:     Thyroid: No thyromegaly.     Vascular: No JVD.     Trachea: No tracheal deviation.  Cardiovascular:     Rate and Rhythm: Normal rate and regular rhythm.     Pulses: Normal pulses.     Heart sounds: Normal heart sounds. No murmur. No friction rub. No gallop.   Pulmonary:     Effort: Pulmonary effort is normal. No respiratory distress.     Breath sounds: Normal breath sounds. No wheezing or rales.  Chest:     Chest wall: No tenderness.  Abdominal:     General: Bowel sounds are normal.     Palpations: Abdomen is soft.     Tenderness: There is no abdominal tenderness.  Musculoskeletal:        General: Normal range of motion.     Cervical  back: Normal range of motion and neck supple.  Lymphadenopathy:     Cervical: No cervical adenopathy.  Skin:    General: Skin is warm and dry.  Neurological:     General: No focal deficit present.     Mental Status: He is alert and oriented to person, place, and time.     Cranial Nerves: No cranial nerve deficit.  Psychiatric:        Mood and Affect: Mood normal.        Behavior: Behavior normal.        Thought Content: Thought content normal.        Judgment: Judgment normal.     LABS: Recent Results (from the past 2160 hour(s))  Urinalysis, Routine w reflex microscopic     Status: None   Collection Time: 01/01/20 12:00 AM  Result Value Ref Range   Specific Gravity, UA 1.026 1.005 - 1.030   pH, UA 7.0 5.0 - 7.5   Color, UA Yellow Yellow   Appearance Ur Clear Clear   Leukocytes,UA Negative Negative   Protein,UA Negative Negative/Trace   Glucose, UA Negative Negative   Ketones, UA Negative Negative   RBC, UA Negative Negative   Bilirubin, UA Negative Negative   Urobilinogen, Ur 1.0 0.2 - 1.0 mg/dL   Nitrite, UA Negative Negative   Microscopic Examination Comment     Comment: Microscopic not indicated and not performed.    Assessment/Plan: 1. Routine general medical examination at a health care facility Annual health maintenance exam today.   2. Mixed hyperlipidemia Check fasting lipid panel with routine, fasting labs. Treat as indicated   3. Dysuria - Urinalysis, Routine w reflex microscopic  General Counseling: Peter Arias verbalizes understanding of the findings of todays visit and agrees with plan of treatment. I have discussed any further diagnostic evaluation that may be needed or ordered today. We also reviewed his medications today. he has been encouraged to call the office with any questions or concerns that should arise related to todays visit.    Counseling:  This patient was seen by Vincent Gros FNP Collaboration with Dr Lyndon Code as a part of collaborative care agreement  Orders Placed This Encounter  Procedures  . Urinalysis, Routine w reflex microscopic    Total time spent: 30 Minutes  Time spent includes review of chart, medications, test results, and follow up plan with the patient.     Lyndon Code, MD  Internal Medicine

## 2020-01-02 LAB — URINALYSIS, ROUTINE W REFLEX MICROSCOPIC
Bilirubin, UA: NEGATIVE
Glucose, UA: NEGATIVE
Ketones, UA: NEGATIVE
Leukocytes,UA: NEGATIVE
Nitrite, UA: NEGATIVE
Protein,UA: NEGATIVE
RBC, UA: NEGATIVE
Specific Gravity, UA: 1.026 (ref 1.005–1.030)
Urobilinogen, Ur: 1 mg/dL (ref 0.2–1.0)
pH, UA: 7 (ref 5.0–7.5)

## 2020-01-07 ENCOUNTER — Other Ambulatory Visit: Payer: Self-pay | Admitting: Nurse Practitioner

## 2020-01-07 DIAGNOSIS — E782 Mixed hyperlipidemia: Secondary | ICD-10-CM | POA: Diagnosis not present

## 2020-01-07 DIAGNOSIS — Z0001 Encounter for general adult medical examination with abnormal findings: Secondary | ICD-10-CM | POA: Diagnosis not present

## 2020-01-08 ENCOUNTER — Telehealth: Payer: Self-pay

## 2020-01-08 LAB — LIPID PANEL WITH LDL/HDL RATIO
Cholesterol, Total: 189 mg/dL (ref 100–199)
HDL: 62 mg/dL (ref >39)
LDL Chol Calc (NIH): 109 mg/dL — ABNORMAL HIGH (ref 0–99)
LDL/HDL Ratio: 1.8 ratio (ref 0.0–3.6)
Triglycerides: 100 mg/dL (ref 0–149)
VLDL Cholesterol Cal: 18 mg/dL (ref 5–40)

## 2020-01-08 LAB — COMPREHENSIVE METABOLIC PANEL
ALT: 16 IU/L (ref 0–44)
AST: 25 IU/L (ref 0–40)
Albumin/Globulin Ratio: 1.8 (ref 1.2–2.2)
Albumin: 5.1 g/dL — ABNORMAL HIGH (ref 4.0–5.0)
Alkaline Phosphatase: 43 IU/L — ABNORMAL LOW (ref 48–121)
BUN/Creatinine Ratio: 17 (ref 9–20)
BUN: 19 mg/dL (ref 6–20)
Bilirubin Total: 0.3 mg/dL (ref 0.0–1.2)
CO2: 26 mmol/L (ref 20–29)
Calcium: 9.8 mg/dL (ref 8.7–10.2)
Chloride: 101 mmol/L (ref 96–106)
Creatinine, Ser: 1.1 mg/dL (ref 0.76–1.27)
GFR calc Af Amer: 101 mL/min/{1.73_m2} (ref >59)
GFR calc non Af Amer: 87 mL/min/{1.73_m2} (ref >59)
Globulin, Total: 2.8 g/dL (ref 1.5–4.5)
Glucose: 83 mg/dL (ref 65–99)
Potassium: 4.7 mmol/L (ref 3.5–5.2)
Sodium: 140 mmol/L (ref 134–144)
Total Protein: 7.9 g/dL (ref 6.0–8.5)

## 2020-01-08 LAB — CBC
Hematocrit: 42.9 % (ref 37.5–51.0)
Hemoglobin: 13.8 g/dL (ref 13.0–17.7)
MCH: 27.5 pg (ref 26.6–33.0)
MCHC: 32.2 g/dL (ref 31.5–35.7)
MCV: 86 fL (ref 79–97)
Platelets: 234 10*3/uL (ref 150–450)
RBC: 5.02 x10E6/uL (ref 4.14–5.80)
RDW: 13.6 % (ref 11.6–15.4)
WBC: 4.6 10*3/uL (ref 3.4–10.8)

## 2020-01-08 LAB — TSH: TSH: 2.02 u[IU]/mL (ref 0.450–4.500)

## 2020-01-08 LAB — T4, FREE: Free T4: 1.34 ng/dL (ref 0.82–1.77)

## 2020-01-08 NOTE — Telephone Encounter (Signed)
-----   Message from Carlean Jews, NP sent at 01/08/2020  9:46 AM EDT ----- Please let the patient know that his labs were good. Thanks.

## 2020-01-08 NOTE — Telephone Encounter (Signed)
Pt advised labs came back normal  

## 2020-01-08 NOTE — Progress Notes (Signed)
Please let the patient know that his labs were good. Thanks.

## 2020-02-06 IMAGING — CR DG HAND COMPLETE 3+V*R*
3 series · 3 of 3 positions shown · non-contrast
Comparison: None.

CLINICAL DATA: Pain and swelling.

EXAM:
RIGHT HAND - COMPLETE 3+ VIEW

[hand ap]
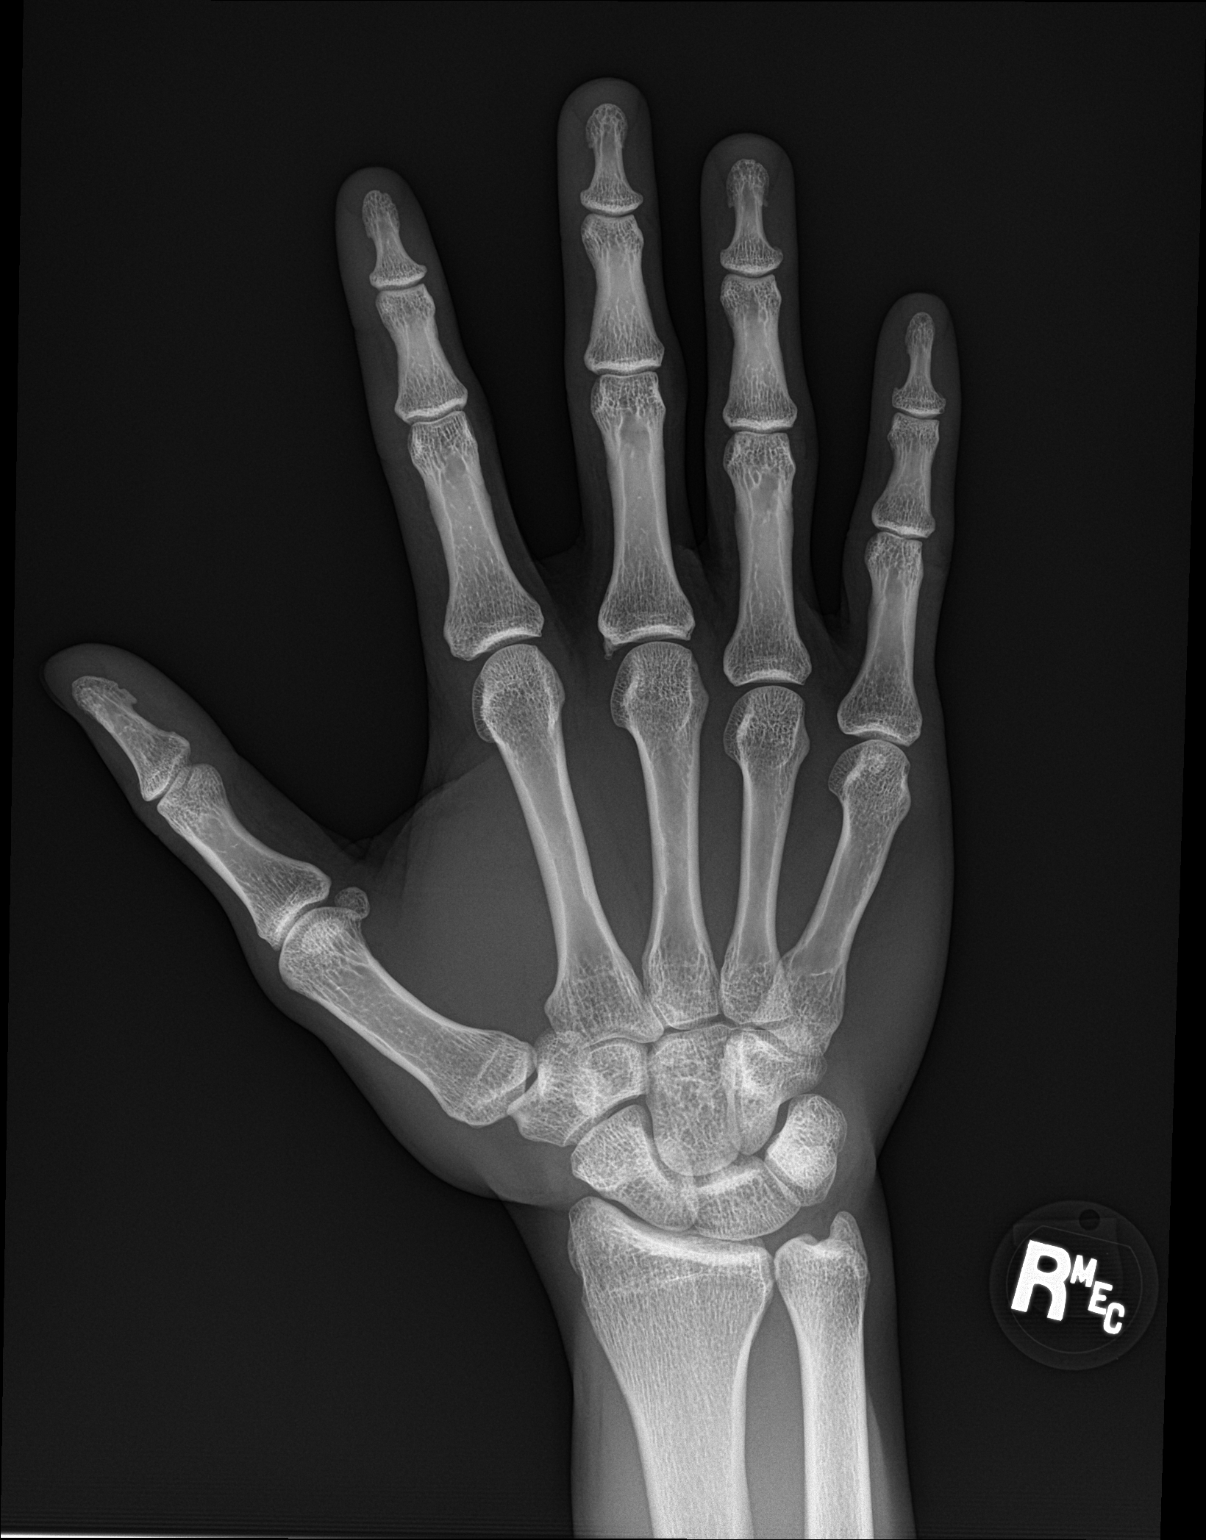

[hand obl]
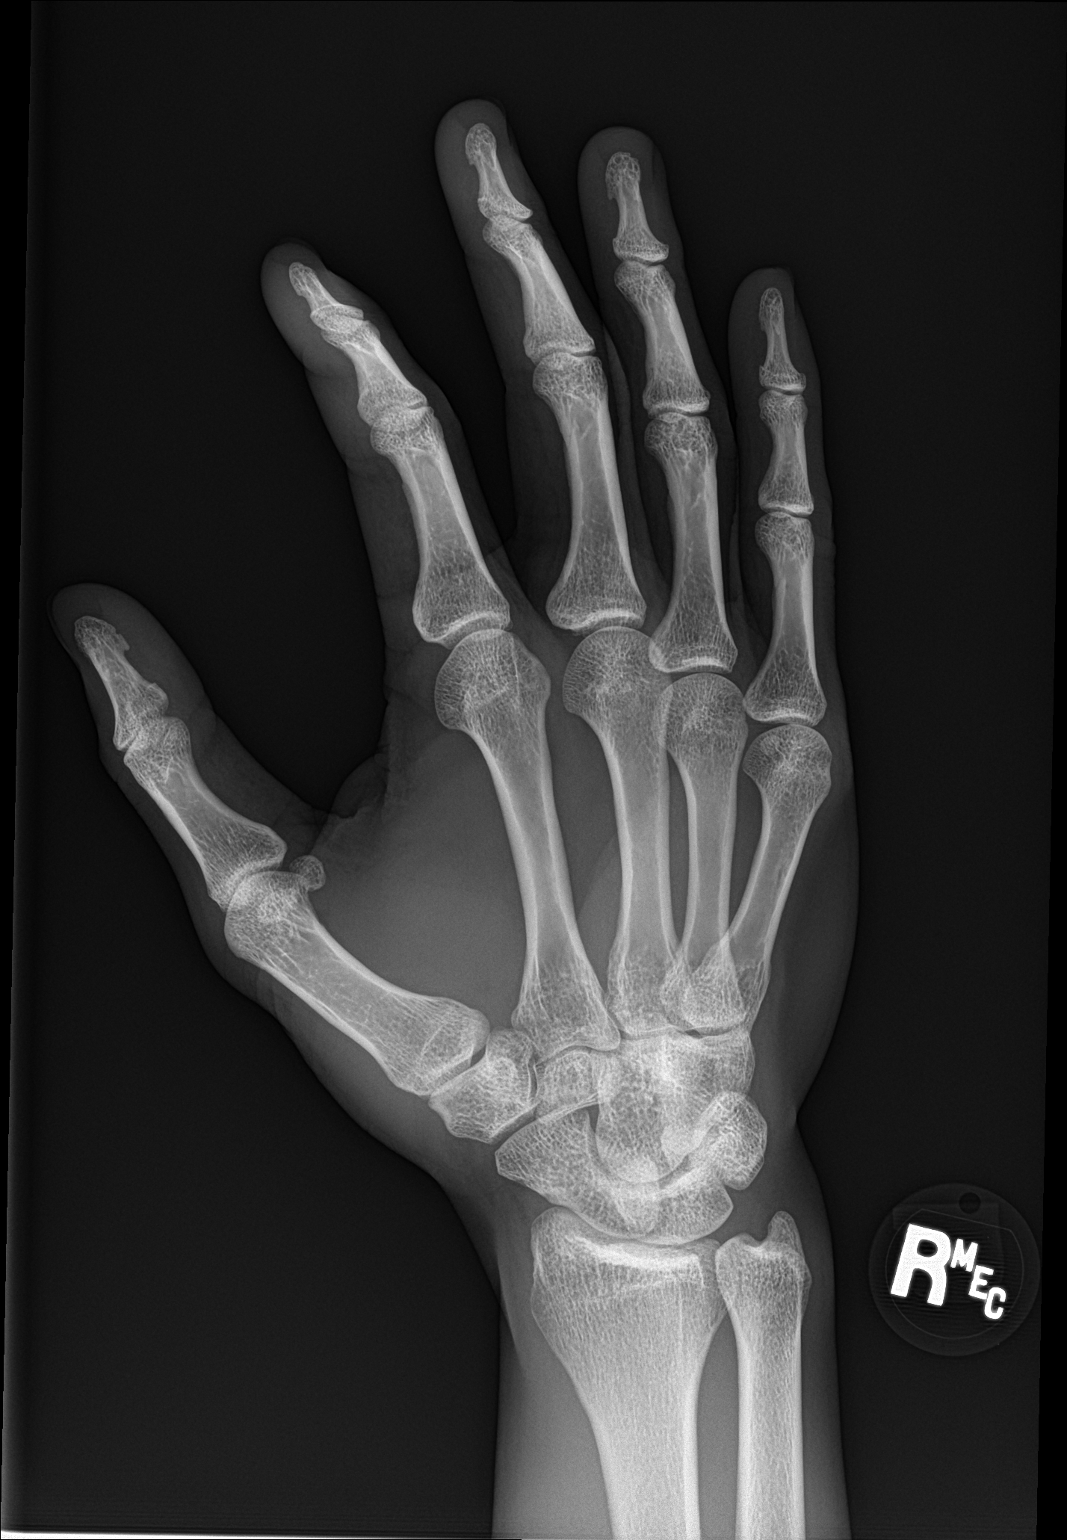

[hand lat]
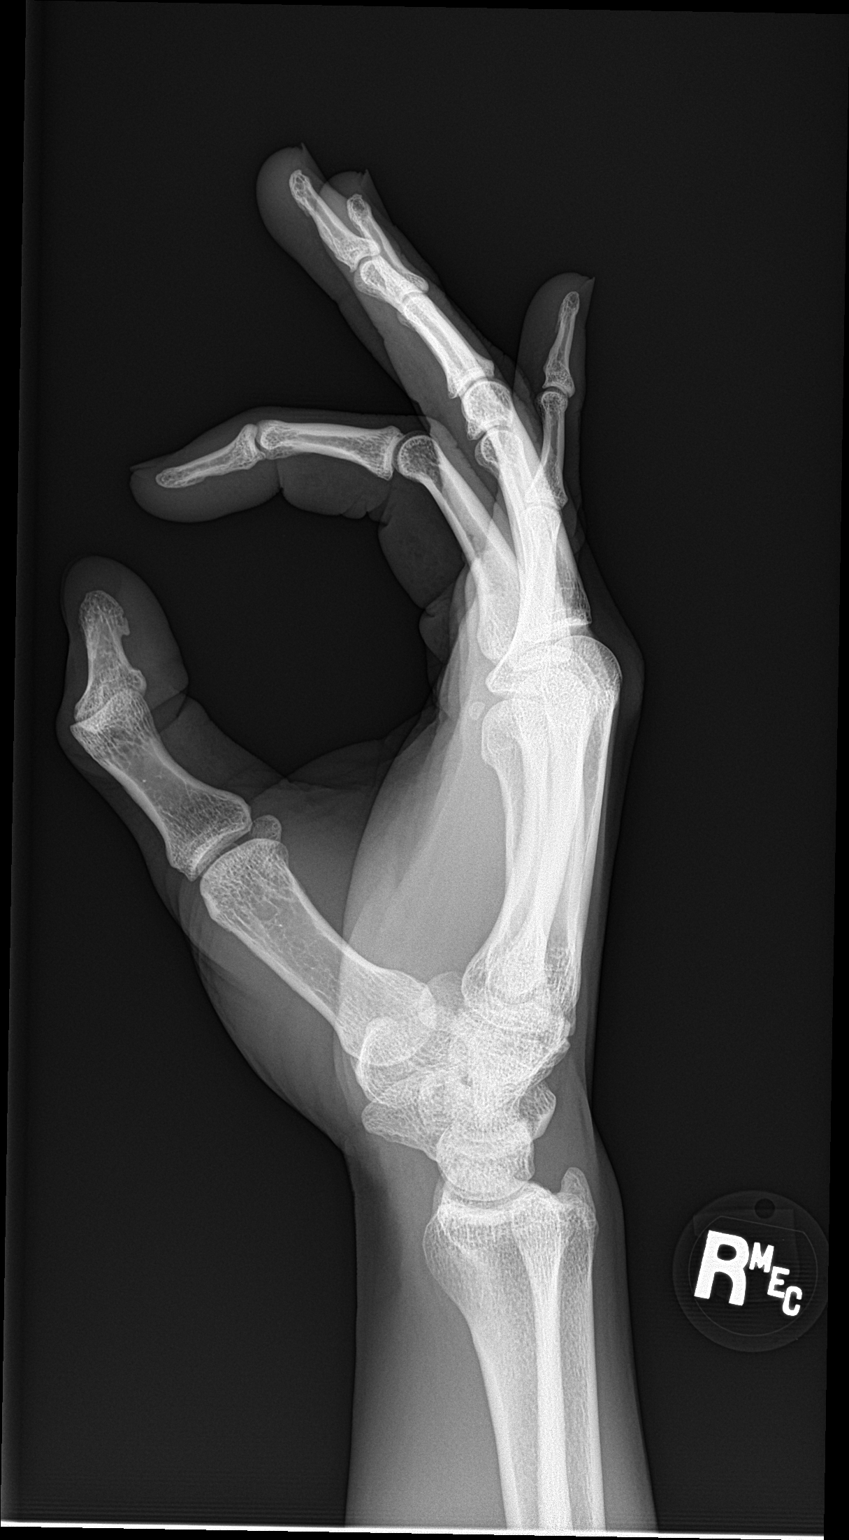

[3 of 3 positions shown; findings below may reference images not displayed]

FINDINGS: Frontal, oblique, and lateral views were obtained. No acute fracture
or dislocation. Joint spaces appear normal. A small exostosis arises
from the lateral proximal most aspect of the third proximal phalanx.
No erosive change.
IMPRESSION: No fracture or dislocation. No appreciable arthropathy. Small benign
exostosis along the lateral aspect of the proximal most aspect of
the third proximal phalanx.

## 2020-07-13 DIAGNOSIS — Z20822 Contact with and (suspected) exposure to covid-19: Secondary | ICD-10-CM | POA: Diagnosis not present

## 2020-07-13 DIAGNOSIS — Z03818 Encounter for observation for suspected exposure to other biological agents ruled out: Secondary | ICD-10-CM | POA: Diagnosis not present

## 2020-12-29 ENCOUNTER — Ambulatory Visit (INDEPENDENT_AMBULATORY_CARE_PROVIDER_SITE_OTHER): Payer: BC Managed Care – PPO | Admitting: Internal Medicine

## 2020-12-29 ENCOUNTER — Other Ambulatory Visit: Payer: Self-pay

## 2020-12-29 ENCOUNTER — Encounter: Payer: Self-pay | Admitting: Internal Medicine

## 2020-12-29 DIAGNOSIS — H6123 Impacted cerumen, bilateral: Secondary | ICD-10-CM

## 2020-12-29 DIAGNOSIS — Z0001 Encounter for general adult medical examination with abnormal findings: Secondary | ICD-10-CM | POA: Diagnosis not present

## 2020-12-29 DIAGNOSIS — R209 Unspecified disturbances of skin sensation: Secondary | ICD-10-CM

## 2020-12-29 DIAGNOSIS — R300 Dysuria: Secondary | ICD-10-CM

## 2020-12-29 DIAGNOSIS — M172 Bilateral post-traumatic osteoarthritis of knee: Secondary | ICD-10-CM | POA: Diagnosis not present

## 2020-12-29 NOTE — Progress Notes (Signed)
[facility] [address] [place], [place] [address]  Internal MEDICINE  Office Visit Note  Patient Name: Peter Arias  [id number]  [id number]  Date of Service: 01/05/2021  Chief Complaint  Patient presents with  . Annual Exam    HPI Pt is here for routine health maintenance examination.  Patient feels well however he is just bothered by being cold all the time and he will like his CBC checked for anemia.  He denies any weight loss.  He exercises on a regular basis.  He does have osteoarthritis of the knees due to playing basketball.  He has a full-time job.  Sleeps well, denies any depression  Current Medication: No outpatient encounter medications on file as of 12/29/2020.   No facility-administered encounter medications on file as of 12/29/2020.    Surgical History: Past Surgical History:  Procedure Laterality Date  . achilles tendon rupture      Medical History: History reviewed. No pertinent past medical history.  Family History: Family History  Problem Relation Age of Onset  . Heart failure Father     Social History: Social History   Socioeconomic History  . Marital status: Single    Spouse name: Not on file  . Number of children: Not on file  . Years of education: Not on file  . Highest education level: Not on file  Occupational History  . Not on file  Tobacco Use  . Smoking status: Never Smoker  . Smokeless tobacco: Never Used  Vaping Use  . Vaping Use: Never used  Substance and Sexual Activity  . Alcohol use: Yes    Alcohol/week: 3.0 standard drinks    Types: 3 Shots of liquor per week    Comment: social/ week  . Drug use: No  . Sexual activity: Not on file  Other Topics Concern  . Not on file  Social History Narrative  . Not on file   Social Determinants of Health   Financial Resource Strain: Not on file  Food Insecurity: Not on file  Transportation Needs: Not on file  Physical Activity: Not on file  Stress: Not on file   Social Connections: Not on file      Review of Systems  Constitutional: Negative for chills, fatigue and unexpected weight change.  HENT: Positive for postnasal drip. Negative for congestion, rhinorrhea, sneezing and sore throat.   Eyes: Negative for redness.  Respiratory: Negative for cough, chest tightness and shortness of breath.   Cardiovascular: Negative for chest pain and palpitations.  Gastrointestinal: Negative for abdominal pain, constipation, diarrhea, nausea and vomiting.  Endocrine: Positive for cold intolerance.  Genitourinary: Negative for dysuria and frequency.  Musculoskeletal: Negative for arthralgias, back pain, joint swelling and neck pain.  Skin: Negative for rash.  Neurological: Negative.  Negative for tremors and numbness.  Hematological: Negative for adenopathy. Does not bruise/bleed easily.  Psychiatric/Behavioral: Negative for behavioral problems (Depression), sleep disturbance and suicidal ideas. The patient is not nervous/anxious.      Vital Signs: BP 132/88   Pulse 67   Temp 98.9 F (37.2 C)   Resp 16   Ht 6\' 1"  (1.854 m)   Wt 170 lb 3.2 oz (77.2 kg)   SpO2 99%   BMI 22.46 kg/m    Physical Exam Constitutional:      Appearance: Normal appearance.  HENT:     Head: Normocephalic and atraumatic.     Right Ear: There is impacted cerumen.     Left Ear: There is impacted  cerumen.     Nose: Nose normal.     Mouth/Throat:     Mouth: Mucous membranes are moist.     Pharynx: No posterior oropharyngeal erythema.  Eyes:     Extraocular Movements: Extraocular movements intact.     Pupils: Pupils are equal, round, and reactive to light.  Cardiovascular:     Rate and Rhythm: Normal rate.     Pulses: Normal pulses.     Heart sounds: Normal heart sounds.  Pulmonary:     Effort: Pulmonary effort is normal.     Breath sounds: Normal breath sounds.  Abdominal:     General: Abdomen is flat.     Palpations: Abdomen is soft.  Musculoskeletal:      Comments: Crepitus in both knees   Neurological:     General: No focal deficit present.     Mental Status: He is alert.  Psychiatric:        Mood and Affect: Mood normal.        Behavior: Behavior normal.          Assessment/Plan: 1. Encounter for general adult medical examination with abnormal findings Age-appropriate preventive health maintenance is updated no family history of colon cancer  2. Post-traumatic osteoarthritis of both knees Patient has crepitus in both knees, probably degenerative joint disease due to trauma while playing basketball patient.  Patient was instructed to avoid high impact exercises and use stationary bike to strengthen his quadriceps muscle  3. Bilateral impacted cerumen Both ears are impacted left more than right, he does use over-the-counter medications to resolve this issue  4. Cold sensation of skin Patient is feeling of cold sensation we will order preliminary laboratory testing possibility of Raynaud's phenomena as discussed.  After lab results are available - Iron, TIBC and Ferritin Panel - B12 and Folate Panel - ANA w/Reflex if Positive; Future  5. Dysuria - UA/M w/rflx Culture, Routine General Counseling: Peter Arias verbalizes understanding of the findings of todays visit and agrees with plan of treatment. I have discussed any further diagnostic evaluation that may be needed or ordered today. We also reviewed his medications today. he has been encouraged to call the office with any questions or concerns that should arise related to todays visit.    Orders Placed This Encounter  Procedures  . Microscopic Examination  . Iron, TIBC and Ferritin Panel  . B12 and Folate Panel  . ANA w/Reflex if Positive  . UA/M w/rflx Culture, Routine    No orders of the defined types were placed in this encounter.  Total time spent:30 Minutes  Time spent includes review of chart, medications, test results, and follow up plan with the patient.      Lyndon Code, MD  Internal Medicine

## 2020-12-30 LAB — UA/M W/RFLX CULTURE, ROUTINE
Bilirubin, UA: NEGATIVE
Glucose, UA: NEGATIVE
Ketones, UA: NEGATIVE
Leukocytes,UA: NEGATIVE
Nitrite, UA: NEGATIVE
Protein,UA: NEGATIVE
RBC, UA: NEGATIVE
Specific Gravity, UA: 1.02 (ref 1.005–1.030)
Urobilinogen, Ur: 1 mg/dL (ref 0.2–1.0)
pH, UA: 5.5 (ref 5.0–7.5)

## 2020-12-30 LAB — IRON,TIBC AND FERRITIN PANEL
Ferritin: 209 ng/mL (ref 30–400)
Iron Saturation: 26 % (ref 15–55)
Iron: 79 ug/dL (ref 38–169)
Total Iron Binding Capacity: 305 ug/dL (ref 250–450)
UIBC: 226 ug/dL (ref 111–343)

## 2020-12-30 LAB — B12 AND FOLATE PANEL
Folate: 12.3 ng/mL (ref >3.0)
Vitamin B-12: 286 pg/mL (ref 232–1245)

## 2020-12-30 LAB — MICROSCOPIC EXAMINATION
Bacteria, UA: NONE SEEN
Casts: NONE SEEN /lpf
Epithelial Cells (non renal): NONE SEEN /hpf (ref 0–10)
WBC, UA: NONE SEEN /hpf (ref 0–5)

## 2020-12-31 ENCOUNTER — Encounter: Payer: BC Managed Care – PPO | Admitting: Nurse Practitioner

## 2021-11-02 ENCOUNTER — Other Ambulatory Visit: Payer: Self-pay

## 2021-11-02 ENCOUNTER — Ambulatory Visit
Admission: RE | Admit: 2021-11-02 | Discharge: 2021-11-02 | Disposition: A | Discharge status: Home / Self Care | Payer: BC Managed Care – PPO | Source: Ambulatory Visit | Attending: Emergency Medicine | Admitting: Emergency Medicine

## 2021-11-02 VITALS — BP 150/90 | HR 86 | Temp 98.1°F | Resp 18

## 2021-11-02 DIAGNOSIS — J358 Other chronic diseases of tonsils and adenoids: Secondary | ICD-10-CM

## 2021-11-02 DIAGNOSIS — J029 Acute pharyngitis, unspecified: Secondary | ICD-10-CM | POA: Diagnosis not present

## 2021-11-02 LAB — POCT RAPID STREP A (OFFICE): Rapid Strep A Screen: NEGATIVE

## 2021-11-02 NOTE — ED Provider Notes (Signed)
?[facility] ? ? ? ?CSN: [id number] ?Arrival date & time: 11/02/21  1011 ? ? ?  ? ?History   ?Chief Complaint ?Chief Complaint  ?Patient presents with  ? Sore Throat  ? ? ?HPI ?Peter SCOGGINS is a 36 y.o. male.  Patient presents with sore throat for 3 to 4 days.  He noticed quite spots on the back of his throat yesterday and would like to be checked for strep.  No fever, rash, arthralgias, cough, shortness of breath, or other symptoms.  Treatment at home with OTC cough syrup. ? ?The history is provided by the patient.  ? ?History reviewed. No pertinent past medical history. ? ?Patient Active Problem List  ? Diagnosis Date Noted  ? Mixed hyperlipidemia 01/01/2020  ? Dysuria 01/15/2019  ? Routine general medical examination at a health care facility 12/21/2017  ? ? ?Past Surgical History:  ?Procedure Laterality Date  ? achilles tendon rupture    ? ? ? ? ? ?Home Medications   ? ?Prior to Admission medications   ?Not on File  ? ? ?Family History ?Family History  ?Problem Relation Age of Onset  ? Heart failure Father   ? ? ?Social History ?Social History  ? ?Tobacco Use  ? Smoking status: Never  ? Smokeless tobacco: Never  ?Vaping Use  ? Vaping Use: Never used  ?Substance Use Topics  ? Alcohol use: Yes  ?  Alcohol/week: 3.0 standard drinks  ?  Types: 3 Shots of liquor per week  ?  Comment: social/ week  ? Drug use: No  ? ? ? ?Allergies   ?Patient has no known allergies. ? ? ?Review of Systems ?Review of Systems  ?Constitutional:  Negative for chills and fever.  ?HENT:  Positive for sore throat. Negative for ear pain, trouble swallowing and voice change.   ?Respiratory:  Negative for cough and shortness of breath.   ?Cardiovascular:  Negative for chest pain and palpitations.  ?Skin:  Negative for color change and rash.  ?All other systems reviewed and are negative. ? ? ?Physical Exam ?Triage Vital Signs ?ED Triage Vitals  ?Enc Vitals Group  ?   BP 11/02/21 1017 (!) 150/90  ?   Pulse Rate 11/02/21 1017 86  ?   Resp  11/02/21 1017 18  ?   Temp 11/02/21 1017 98.1 ?F (36.7 ?C)  ?   Temp src --   ?   SpO2 11/02/21 1017 98 %  ?   Weight --   ?   Height --   ?   Head Circumference --   ?   Peak Flow --   ?   Pain Score 11/02/21 1024 0  ?   Pain Loc --   ?   Pain Edu? --   ?   Excl. in GC? --   ? ?No data found. ? ?Updated Vital Signs ?BP (!) 150/90   Pulse 86   Temp 98.1 ?F (36.7 ?C)   Resp 18   SpO2 98%  ? ?Visual Acuity ?Right Eye Distance:   ?Left Eye Distance:   ?Bilateral Distance:   ? ?Right Eye Near:   ?Left Eye Near:    ?Bilateral Near:    ? ?Physical Exam ?Vitals and nursing note reviewed.  ?Constitutional:   ?   General: He is not in acute distress. ?   Appearance: He is well-developed. He is not ill-appearing.  ?HENT:  ?   Right Ear: Tympanic membrane normal.  ?   Left Ear:  Tympanic membrane normal.  ?   Nose: Nose normal.  ?   Mouth/Throat:  ?   Mouth: Mucous membranes are moist.  ?   Pharynx: Posterior oropharyngeal erythema present.  ?   Comments: Mild posterior oropharyngeal erythema with two small tonsil stones on left.  ?Cardiovascular:  ?   Rate and Rhythm: Normal rate and regular rhythm.  ?   Heart sounds: Normal heart sounds.  ?Pulmonary:  ?   Effort: Pulmonary effort is normal. No respiratory distress.  ?   Breath sounds: Normal breath sounds.  ?Musculoskeletal:  ?   Cervical back: Neck supple.  ?Skin: ?   General: Skin is warm and dry.  ?Neurological:  ?   Mental Status: He is alert.  ?Psychiatric:     ?   Mood and Affect: Mood normal.     ?   Behavior: Behavior normal.  ? ? ? ?UC Treatments / Results  ?Labs ?(all labs ordered are listed, but only abnormal results are displayed) ?Labs Reviewed  ?POCT RAPID STREP A (OFFICE)  ? ? ?EKG ? ? ?Radiology ?No results found. ? ?Procedures ?Procedures (including critical care time) ? ?Medications Ordered in UC ?Medications - No data to display ? ?Initial Impression / Assessment and Plan / UC Course  ?I have reviewed the triage vital signs and the nursing  notes. ? ?Pertinent labs & imaging results that were available during my care of the patient were reviewed by me and considered in my medical decision making (see chart for details). ? ?  ?Sore throat, tonsil stones.  Rapid strep negative.  Education provided on tonsil stones and on sore throat.  Symptomatic treatment discussed.  Instructed patient to follow up with his PCP or an ENT if his symptoms are not improving.  He agrees to plan of care.  ? ? ?Final Clinical Impressions(s) / UC Diagnoses  ? ?Final diagnoses:  ?Sore throat  ?Tonsillolith  ? ? ? ?Discharge Instructions   ? ?  ?Your strep test is negative.  Follow up with your primary care provider or an ENT if your symptoms are not improving.   ? ? ? ? ? ?ED Prescriptions   ?None ?  ? ?PDMP not reviewed this encounter. ?  ?Mickie Bail, NP ?11/02/21 1108 ? ?

## 2021-11-02 NOTE — Discharge Instructions (Addendum)
Your strep test is negative.  Follow up with your primary care provider or an ENT if your symptoms are not improving.   ? ?

## 2021-11-02 NOTE — ED Triage Notes (Signed)
Pt c/o throat feeling irritated x 3-4 days.  ?

## 2021-12-30 ENCOUNTER — Ambulatory Visit (INDEPENDENT_AMBULATORY_CARE_PROVIDER_SITE_OTHER): Payer: BC Managed Care – PPO | Admitting: Nurse Practitioner

## 2021-12-30 ENCOUNTER — Encounter: Payer: Self-pay | Admitting: Nurse Practitioner

## 2021-12-30 VITALS — BP 139/79 | HR 63 | Temp 98.5°F | Resp 16 | Ht 73.0 in | Wt 176.3 lb

## 2021-12-30 DIAGNOSIS — E559 Vitamin D deficiency, unspecified: Secondary | ICD-10-CM

## 2021-12-30 DIAGNOSIS — M172 Bilateral post-traumatic osteoarthritis of knee: Secondary | ICD-10-CM | POA: Diagnosis not present

## 2021-12-30 DIAGNOSIS — Z0001 Encounter for general adult medical examination with abnormal findings: Secondary | ICD-10-CM | POA: Diagnosis not present

## 2021-12-30 DIAGNOSIS — H6121 Impacted cerumen, right ear: Secondary | ICD-10-CM | POA: Diagnosis not present

## 2021-12-30 DIAGNOSIS — E782 Mixed hyperlipidemia: Secondary | ICD-10-CM | POA: Diagnosis not present

## 2021-12-30 DIAGNOSIS — E538 Deficiency of other specified B group vitamins: Secondary | ICD-10-CM

## 2021-12-30 NOTE — Progress Notes (Signed)
Ear lavage completed on both ears and cleared of all cerumen.  Pt informed that he may have some dizziness and to be cautious ?

## 2021-12-30 NOTE — Progress Notes (Signed)
[facility] [place], [place] [address]  Internal MEDICINE  Office Visit Note  Patient Name: Peter Arias  [id number]  [id number]  Date of Service: 12/30/2021  Chief Complaint  Patient presents with   Annual Exam    HPI Peter Arias presents for an annual well visit and physical exam.  Peter Arias is a well-appearing 36 year old male with no significant chronic medical conditions.  Peter Arias does have mild hyperlipidemia and borderline low B12 level.  Peter Arias is due for repeat routine labs and his exam today.  Peter Arias denies any significant changes in his health or medical care since his previous office visit.  Peter Arias is not on any medications at this time.  Peter Arias has no new or worsening pains. Patient feels well however Peter Arias is just bothered by being cold all the time which is consistent with his exam last year. Peter Arias denies any weight loss.  Peter Arias exercises on a regular basis.  Peter Arias does have osteoarthritis of the knees due to playing basketball.  Peter Arias has a full-time job.  Sleeps well, denies any depression    Current Medication: No outpatient encounter medications on file as of 12/30/2021.   No facility-administered encounter medications on file as of 12/30/2021.    Surgical History: Past Surgical History:  Procedure Laterality Date   achilles tendon rupture      Medical History: History reviewed. No pertinent past medical history.  Family History: Family History  Problem Relation Age of Onset   Heart failure Father     Social History   Socioeconomic History   Marital status: Single    Spouse name: Not on file   Number of children: Not on file   Years of education: Not on file   Highest education level: Not on file  Occupational History   Not on file  Tobacco Use   Smoking status: Never   Smokeless tobacco: Never  Vaping Use   Vaping Use: Never used  Substance and Sexual Activity   Alcohol use: Yes    Alcohol/week: 3.0 standard drinks    Types: 3 Shots of liquor per week    Comment:  social/ week   Drug use: Yes    Types: Marijuana    Comment: Edibles   Sexual activity: Not on file  Other Topics Concern   Not on file  Social History Narrative   Not on file   Social Determinants of Health   Financial Resource Strain: Not on file  Food Insecurity: Not on file  Transportation Needs: Not on file  Physical Activity: Not on file  Stress: Not on file  Social Connections: Not on file  Intimate Partner Violence: Not on file      Review of Systems  Constitutional:  Negative for activity change, appetite change, chills, fatigue, fever and unexpected weight change.  HENT: Negative.  Negative for congestion, ear pain, rhinorrhea, sore throat and trouble swallowing.   Eyes: Negative.   Respiratory: Negative.  Negative for cough, chest tightness, shortness of breath and wheezing.   Cardiovascular: Negative.  Negative for chest pain and palpitations.  Gastrointestinal: Negative.  Negative for abdominal pain, blood in stool, constipation, diarrhea, nausea and vomiting.  Endocrine: Negative.   Genitourinary: Negative.  Negative for difficulty urinating, dysuria, frequency, hematuria and urgency.  Musculoskeletal: Negative.  Negative for arthralgias, back pain, joint swelling, myalgias and neck pain.  Skin: Negative.  Negative for rash and wound.  Allergic/Immunologic: Negative.  Negative for immunocompromised state.  Neurological: Negative.  Negative for dizziness,  seizures, numbness and headaches.  Hematological: Negative.   Psychiatric/Behavioral: Negative.  Negative for behavioral problems, self-injury and suicidal ideas. The patient is not nervous/anxious.    Vital Signs: BP 139/79   Pulse 63   Temp 98.5 F (36.9 C)   Resp 16   Wt 176 lb 4.8 oz (80 kg)   SpO2 99%   BMI 23.26 kg/m    Physical Exam Vitals reviewed.  Constitutional:      General: Peter Arias is not in acute distress.    Appearance: Normal appearance. Peter Arias is normal weight. Peter Arias is not ill-appearing.   HENT:     Head: Normocephalic and atraumatic.     Right Ear: There is impacted cerumen.     Left Ear: Tympanic membrane, ear canal and external ear normal.     Nose: Nose normal. No congestion or rhinorrhea.     Mouth/Throat:     Mouth: Mucous membranes are moist.     Pharynx: Oropharynx is clear. No oropharyngeal exudate or posterior oropharyngeal erythema.  Eyes:     Extraocular Movements: Extraocular movements intact.     Conjunctiva/sclera: Conjunctivae normal.     Pupils: Pupils are equal, round, and reactive to light.  Cardiovascular:     Rate and Rhythm: Normal rate and regular rhythm.     Pulses: Normal pulses.     Heart sounds: Normal heart sounds. No murmur heard. Pulmonary:     Effort: Pulmonary effort is normal. No respiratory distress.     Breath sounds: Normal breath sounds. No wheezing.  Abdominal:     General: Abdomen is flat.     Palpations: Abdomen is soft.  Musculoskeletal:        General: No swelling or tenderness. Normal range of motion.     Cervical back: Normal range of motion and neck supple.     Right lower leg: No edema.     Left lower leg: No edema.     Comments: Crepitus in both knees   Lymphadenopathy:     Cervical: No cervical adenopathy.  Skin:    General: Skin is warm and dry.     Capillary Refill: Capillary refill takes less than 2 seconds.  Neurological:     General: No focal deficit present.     Mental Status: Peter Arias is alert and oriented to person, place, and time.     Cranial Nerves: No cranial nerve deficit.     Coordination: Coordination normal.     Gait: Gait normal.  Psychiatric:        Mood and Affect: Mood normal.        Behavior: Behavior normal.        Thought Content: Thought content normal.        Judgment: Judgment normal.       Assessment/Plan: 1. Encounter for general adult medical examination with abnormal findings Age-appropriate preventive screenings and vaccinations discussed, annual physical exam completed.  Routine labs for health maintenance ordered, see below. PHM updated.  - CBC with Differential/Platelet - CMP14+EGFR  2. Post-traumatic osteoarthritis of both knees Patient reports minimal pain no significant change, manageable at this time - CBC with Differential/Platelet - CMP14+EGFR  3. Mixed hyperlipidemia Slightly elevated LDL of 109 last year, routine labs ordered for repeat - CBC with Differential/Platelet - CMP14+EGFR - Lipid Profile  4. B12 deficiency Borderline low vitamin B12 last year, routine labs ordered - CBC with Differential/Platelet - CMP14+EGFR - B12 and Folate Panel  5. Vitamin D deficiency Routine labs ordered - CBC with  Differential/Platelet - CMP14+EGFR - Vitamin D (25 hydroxy)  6. Impacted cerumen of right ear Right ear canal is impacted with wax, there are large done in office and the wax was successfully cleared. - Ear Lavage      General Counseling: Peter Arias verbalizes understanding of the findings of todays visit and agrees with plan of treatment. I have discussed any further diagnostic evaluation that may be needed or ordered today. We also reviewed his medications today. Peter Arias has been encouraged to call the office with any questions or concerns that should arise related to todays visit.    Orders Placed This Encounter  Procedures   CBC with Differential/Platelet   CMP14+EGFR   Lipid Profile   Vitamin D (25 hydroxy)   B12 and Folate Panel   Ear Lavage    No orders of the defined types were placed in this encounter.   Return in about 1 year (around 12/31/2022) for CPE, Van PCP.   Total time spent:30 Minutes Time spent includes review of chart, medications, test results, and follow up plan with the patient.   [place] Controlled Substance Database was reviewed by me.  This patient was seen by Jonetta Osgood, FNP-C in collaboration with Dr. Clayborn Bigness as a part of collaborative care agreement.  Krysia Zahradnik R. Valetta Fuller, MSN, FNP-C Internal  medicine

## 2021-12-31 ENCOUNTER — Encounter: Payer: Self-pay | Admitting: Nurse Practitioner

## 2021-12-31 LAB — CMP14+EGFR
ALT: 12 IU/L (ref 0–44)
AST: 20 IU/L (ref 0–40)
Albumin/Globulin Ratio: 1.8 (ref 1.2–2.2)
Albumin: 4.9 g/dL (ref 4.0–5.0)
Alkaline Phosphatase: 40 IU/L — ABNORMAL LOW (ref 44–121)
BUN/Creatinine Ratio: 10 (ref 9–20)
BUN: 11 mg/dL (ref 6–20)
Bilirubin Total: 0.4 mg/dL (ref 0.0–1.2)
CO2: 27 mmol/L (ref 20–29)
Calcium: 9.7 mg/dL (ref 8.7–10.2)
Chloride: 101 mmol/L (ref 96–106)
Creatinine, Ser: 1.12 mg/dL (ref 0.76–1.27)
Globulin, Total: 2.7 g/dL (ref 1.5–4.5)
Glucose: 91 mg/dL (ref 70–99)
Potassium: 4.4 mmol/L (ref 3.5–5.2)
Sodium: 141 mmol/L (ref 134–144)
Total Protein: 7.6 g/dL (ref 6.0–8.5)
eGFR: 87 mL/min/{1.73_m2} (ref >59)

## 2021-12-31 LAB — CBC WITH DIFFERENTIAL/PLATELET
Basophils Absolute: 0 10*3/uL (ref 0.0–0.2)
Basos: 1 %
EOS (ABSOLUTE): 0 10*3/uL (ref 0.0–0.4)
Eos: 1 %
Hematocrit: 42.3 % (ref 37.5–51.0)
Hemoglobin: 14.1 g/dL (ref 13.0–17.7)
Immature Grans (Abs): 0 10*3/uL (ref 0.0–0.1)
Immature Granulocytes: 0 %
Lymphocytes Absolute: 1.1 10*3/uL (ref 0.7–3.1)
Lymphs: 32 %
MCH: 27.5 pg (ref 26.6–33.0)
MCHC: 33.3 g/dL (ref 31.5–35.7)
MCV: 83 fL (ref 79–97)
Monocytes Absolute: 0.3 10*3/uL (ref 0.1–0.9)
Monocytes: 9 %
Neutrophils Absolute: 2 10*3/uL (ref 1.4–7.0)
Neutrophils: 57 %
Platelets: 218 10*3/uL (ref 150–450)
RBC: 5.13 x10E6/uL (ref 4.14–5.80)
RDW: 13.3 % (ref 11.6–15.4)
WBC: 3.4 10*3/uL (ref 3.4–10.8)

## 2021-12-31 LAB — LIPID PANEL
Chol/HDL Ratio: 4.3 ratio (ref 0.0–5.0)
Cholesterol, Total: 228 mg/dL — ABNORMAL HIGH (ref 100–199)
HDL: 53 mg/dL (ref >39)
LDL Chol Calc (NIH): 159 mg/dL — ABNORMAL HIGH (ref 0–99)
Triglycerides: 88 mg/dL (ref 0–149)
VLDL Cholesterol Cal: 16 mg/dL (ref 5–40)

## 2021-12-31 LAB — B12 AND FOLATE PANEL
Folate: 9.4 ng/mL (ref >3.0)
Vitamin B-12: 213 pg/mL — ABNORMAL LOW (ref 232–1245)

## 2021-12-31 LAB — VITAMIN D 25 HYDROXY (VIT D DEFICIENCY, FRACTURES): Vit D, 25-Hydroxy: 11.9 ng/mL — ABNORMAL LOW (ref 30.0–100.0)

## 2022-01-04 ENCOUNTER — Telehealth: Payer: Self-pay

## 2022-01-07 NOTE — Progress Notes (Signed)
Please call the patient and let him know his results: -- His metabolic panel was grossly normal.  Liver and kidney function are normal. --His total cholesterol and LDL levels are significantly elevated when compared to 2 years ago.  Please encourage the patient to limit his red meat intake, and increase his lean protein intake such as chicken [place] and fish.  It would also be a good idea to start taking an over-the-counter omega-3 fish oil supplement.  I also recommend to recheck the cholesterol level in 6 months and if there is no significant improvement it would be beneficial to start prescription statin therapy. --His vitamin D level is significantly low at 11.9.  Please send a prescription to his pharmacy for vitamin D 50,000 unit capsule, take 1 capsule by mouth once weekly, number of capsules 12, refills 1.  We will recheck the vitamin D level in 6 months. --Vitamin B12 level is significantly low at 213.  I recommend weekly B12 injections x3 weeks followed by monthly B12 injections x5 months.  Please have him make a nurse visit for his first B12 injection.  Will repeat the B12 level in 6 months. --CBC is normal, no anemia.

## 2022-01-09 ENCOUNTER — Telehealth: Payer: Self-pay

## 2022-01-09 ENCOUNTER — Other Ambulatory Visit: Payer: Self-pay

## 2022-01-09 MED ORDER — ERGOCALCIFEROL 1.25 MG (50000 UT) PO CAPS: 50000.0000 [IU] | ORAL_CAPSULE | ORAL | 1 refills | Status: AC

## 2022-01-09 NOTE — Telephone Encounter (Signed)
Spoke to pt and informed him of his lab results were normal except his cholesterol and Alyssa recommends pt limit his red meat intake and increase   his lean protein intake such as chicken [place] and fish.  It would also be a good idea to start taking an over-the-counter omega-3 fish oil supplement.  I also recommend to recheck the cholesterol level in 6 months and if there is no significant improvement it would be beneficial to start prescription statin therapy. --His vitamin D level is significantly low at 11.9.  Please send a prescription to his pharmacy for vitamin D 50,000 unit capsule, take 1 capsule by mouth once weekly, number of capsules 12, refills 1.  We will recheck the vitamin D level in 6 months. --Vitamin B12 level is significantly low at 213.  I recommend weekly B12 injections x3 weeks followed by monthly B12 injections x5 months.  Please have him make a nurse visit for his first B12 injection.  Will repeat the B12 level in 6 months. --CBC is normal, no anemia.          I sent the prescription for drisdol to pharmacy and also pt made nurse visit appt for his first B12 injection ( for 3 weeks then once a month for 5 months).  Pt understood instructions and information on lab results

## 2022-01-09 NOTE — Telephone Encounter (Signed)
-----   Message from Sallyanne Kuster, NP sent at 01/07/2022 10:05 AM EDT ----- Please call the patient and let him know his results: -- His metabolic panel was grossly normal.  Liver and kidney function are normal. --His total cholesterol and LDL levels are significantly elevated when compared to 2 years ago.  Please encourage the patient to limit his red meat intake, and increase his lean protein intake such as chicken [place] and fish.  It would also be a good idea to start taking an over-the-counter omega-3 fish oil supplement.  I also recommend to recheck the cholesterol level in 6 months and if there is no significant improvement it would be beneficial to start prescription statin therapy. --His vitamin D level is significantly low at 11.9.  Please send a prescription to his pharmacy for vitamin D 50,000 unit capsule, take 1 capsule by mouth once weekly, number of capsules 12, refills 1.  We will recheck the vitamin D level in 6 months. --Vitamin B12 level is significantly low at 213.  I recommend weekly B12 injections x3 weeks followed by monthly B12 injections x5 months.  Please have him make a nurse visit for his first B12 injection.  Will repeat the B12 level in 6 months. --CBC is normal, no anemia.

## 2022-01-09 NOTE — Telephone Encounter (Signed)
error 

## 2022-01-10 ENCOUNTER — Ambulatory Visit (INDEPENDENT_AMBULATORY_CARE_PROVIDER_SITE_OTHER): Payer: BC Managed Care – PPO

## 2022-01-10 DIAGNOSIS — E538 Deficiency of other specified B group vitamins: Secondary | ICD-10-CM

## 2022-01-10 MED ORDER — CYANOCOBALAMIN 1000 MCG/ML IJ SOLN
1000.0000 ug | Freq: Once | INTRAMUSCULAR | Status: AC
  Administered 2022-01-10: 1000 ug via INTRAMUSCULAR

## 2022-01-17 ENCOUNTER — Ambulatory Visit (INDEPENDENT_AMBULATORY_CARE_PROVIDER_SITE_OTHER): Payer: BC Managed Care – PPO

## 2022-01-17 DIAGNOSIS — E538 Deficiency of other specified B group vitamins: Secondary | ICD-10-CM

## 2022-01-17 MED ORDER — CYANOCOBALAMIN 1000 MCG/ML IJ SOLN
1000.0000 ug | Freq: Once | INTRAMUSCULAR | Status: AC
  Administered 2022-01-17: 1000 ug via INTRAMUSCULAR

## 2022-01-24 ENCOUNTER — Ambulatory Visit (INDEPENDENT_AMBULATORY_CARE_PROVIDER_SITE_OTHER): Payer: BC Managed Care – PPO

## 2022-01-24 DIAGNOSIS — E538 Deficiency of other specified B group vitamins: Secondary | ICD-10-CM

## 2022-01-24 MED ORDER — CYANOCOBALAMIN 1000 MCG/ML IJ SOLN
1000.0000 ug | Freq: Once | INTRAMUSCULAR | Status: AC
  Administered 2022-01-24: 1000 ug via INTRAMUSCULAR

## 2022-02-20 ENCOUNTER — Ambulatory Visit: Payer: BC Managed Care – PPO

## 2022-02-22 ENCOUNTER — Ambulatory Visit (INDEPENDENT_AMBULATORY_CARE_PROVIDER_SITE_OTHER): Payer: BC Managed Care – PPO

## 2022-02-22 DIAGNOSIS — E538 Deficiency of other specified B group vitamins: Secondary | ICD-10-CM

## 2022-02-22 MED ORDER — CYANOCOBALAMIN 1000 MCG/ML IJ SOLN
1000.0000 ug | Freq: Once | INTRAMUSCULAR | Status: AC
  Administered 2022-02-22: 1000 ug via INTRAMUSCULAR

## 2022-03-20 ENCOUNTER — Telehealth: Payer: Self-pay

## 2022-03-20 ENCOUNTER — Ambulatory Visit (INDEPENDENT_AMBULATORY_CARE_PROVIDER_SITE_OTHER): Payer: BC Managed Care – PPO

## 2022-03-20 DIAGNOSIS — E538 Deficiency of other specified B group vitamins: Secondary | ICD-10-CM | POA: Diagnosis not present

## 2022-03-20 MED ORDER — CYANOCOBALAMIN 1000 MCG/ML IJ SOLN
1000.0000 ug | Freq: Once | INTRAMUSCULAR | Status: AC
  Administered 2022-03-20: 1000 ug via INTRAMUSCULAR

## 2022-03-21 ENCOUNTER — Ambulatory Visit: Payer: BC Managed Care – PPO

## 2022-03-22 ENCOUNTER — Telehealth: Payer: Self-pay

## 2022-03-22 NOTE — Telephone Encounter (Signed)
error 

## 2022-03-22 NOTE — Telephone Encounter (Signed)
Pt came for B12 inj and was asking if you knew his blood type, I advised that he may have to have labs drawn in order to find out.. if so pt wants to have this done next visit    Spoke to pt again and informed him that he will need to make an appt in order to discuss having labs done to find out his blood type.  Pt advised that he will call and schedule an earlier appt before his next one to talk to provider.

## 2022-03-29 ENCOUNTER — Ambulatory Visit: Payer: BC Managed Care – PPO | Admitting: Nurse Practitioner

## 2022-04-25 ENCOUNTER — Ambulatory Visit (INDEPENDENT_AMBULATORY_CARE_PROVIDER_SITE_OTHER): Payer: BC Managed Care – PPO | Admitting: Nurse Practitioner

## 2022-04-25 ENCOUNTER — Ambulatory Visit: Payer: BC Managed Care – PPO

## 2022-04-25 ENCOUNTER — Encounter: Payer: Self-pay | Admitting: Nurse Practitioner

## 2022-04-25 VITALS — BP 126/77 | HR 73 | Temp 98.3°F | Resp 16 | Ht 73.0 in | Wt 172.8 lb

## 2022-04-25 DIAGNOSIS — Z0189 Encounter for other specified special examinations: Secondary | ICD-10-CM | POA: Diagnosis not present

## 2022-04-25 DIAGNOSIS — D519 Vitamin B12 deficiency anemia, unspecified: Secondary | ICD-10-CM | POA: Diagnosis not present

## 2022-04-25 DIAGNOSIS — E538 Deficiency of other specified B group vitamins: Secondary | ICD-10-CM

## 2022-04-25 MED ORDER — CYANOCOBALAMIN 1000 MCG/ML IJ SOLN
1000.0000 ug | Freq: Once | INTRAMUSCULAR | Status: AC
  Administered 2022-04-25: 1000 ug via INTRAMUSCULAR

## 2022-04-25 NOTE — Progress Notes (Signed)
[facility] [address] [place], [place] [address]  Internal MEDICINE  Office Visit Note  Patient Name: Peter Arias  [id number]  [id number]  Date of Service: 04/25/2022  Chief Complaint  Patient presents with   Follow-up    HPI Peter Arias presents for a follow-up visit for   Peter Arias presents for follow-up visit for B12 deficiency, anemia and interested to find out what his blood type is. -- Due for monthly B12 injection today -- Would like to have labs drawn to find out what his blood type is -- No other concerns or questions today    Current Medication: Outpatient Encounter Medications as of 04/25/2022  Medication Sig   ergocalciferol (VITAMIN D2) 1.25 MG (50000 UT) capsule Take 1 capsule (50,000 Units total) by mouth once a week.   [EXPIRED] cyanocobalamin (VITAMIN B12) injection 1,000 mcg    No facility-administered encounter medications on file as of 04/25/2022.    Surgical History: Past Surgical History:  Procedure Laterality Date   achilles tendon rupture      Medical History: History reviewed. No pertinent past medical history.  Family History: Family History  Problem Relation Age of Onset   Heart failure Father     Social History   Socioeconomic History   Marital status: Single    Spouse name: Not on file   Number of children: Not on file   Years of education: Not on file   Highest education level: Not on file  Occupational History   Not on file  Tobacco Use   Smoking status: Never   Smokeless tobacco: Never  Vaping Use   Vaping Use: Never used  Substance and Sexual Activity   Alcohol use: Yes    Alcohol/week: 3.0 standard drinks of alcohol    Types: 3 Shots of liquor per week    Comment: social/ week   Drug use: Yes    Types: Marijuana    Comment: Edibles   Sexual activity: Not on file  Other Topics Concern   Not on file  Social History Narrative   Not on file   Social Determinants of Health   Financial Resource Strain: Not on  file  Food Insecurity: Not on file  Transportation Needs: Not on file  Physical Activity: Not on file  Stress: Not on file  Social Connections: Not on file  Intimate Partner Violence: Not on file      Review of Systems  Constitutional:  Negative for chills, fatigue and unexpected weight change.  HENT:  Negative for congestion, rhinorrhea, sneezing and sore throat.   Eyes:  Negative for redness.  Respiratory:  Negative for cough, chest tightness and shortness of breath.   Cardiovascular:  Negative for chest pain and palpitations.  Gastrointestinal:  Negative for abdominal pain, constipation, diarrhea, nausea and vomiting.  Genitourinary:  Negative for dysuria and frequency.  Musculoskeletal:  Negative for arthralgias, back pain, joint swelling and neck pain.  Skin:  Negative for rash.  Neurological: Negative.  Negative for tremors and numbness.  Hematological:  Negative for adenopathy. Does not bruise/bleed easily.  Psychiatric/Behavioral:  Negative for behavioral problems (Depression), sleep disturbance and suicidal ideas. The patient is not nervous/anxious.     Vital Signs: BP 126/77   Pulse 73   Temp 98.3 F (36.8 C)   Resp 16   Ht 6\' 1"  (1.854 m)   Wt 172 lb 12.8 oz (78.4 kg)   SpO2 98%   BMI 22.80 kg/m    Physical Exam Vitals reviewed.  Constitutional:      General: He is not in acute distress.    Appearance: Normal appearance. He is normal weight. He is not ill-appearing.  HENT:     Head: Normocephalic and atraumatic.  Eyes:     Pupils: Pupils are equal, round, and reactive to light.  Cardiovascular:     Rate and Rhythm: Normal rate and regular rhythm.  Pulmonary:     Effort: Pulmonary effort is normal. No respiratory distress.  Neurological:     Mental Status: He is alert and oriented to person, place, and time.  Psychiatric:        Mood and Affect: Mood normal.        Behavior: Behavior normal.        Assessment/Plan: 1. Anemia due to vitamin B12  deficiency, unspecified B12 deficiency type B12 injection administered, written order for type and screen provided to patient and will go to [facility] outpatient lab to have drawn. If this does not work, patient will see what his blood type is the next time [employer] does a blood drive at his work.  - cyanocobalamin (VITAMIN B12) injection 1,000 mcg  2. Encounter for routine laboratory testing See problem #1   General Counseling: Peter Arias verbalizes understanding of the findings of todays visit and agrees with plan of treatment. I have discussed any further diagnostic evaluation that may be needed or ordered today. We also reviewed his medications today. he has been encouraged to call the office with any questions or concerns that should arise related to todays visit.    No orders of the defined types were placed in this encounter.   Meds ordered this encounter  Medications   cyanocobalamin (VITAMIN B12) injection 1,000 mcg    Return for F/U as needed.   Total time spent:20 Minutes Time spent includes review of chart, medications, test results, and follow up plan with the patient.   [place] Controlled Substance Database was reviewed by me.  This patient was seen by Sallyanne Kuster, FNP-C in collaboration with Dr. Beverely Risen as a part of collaborative care agreement.   Demika Langenderfer R. Tedd Sias, MSN, FNP-C Internal medicine

## 2022-05-14 DIAGNOSIS — R0789 Other chest pain: Secondary | ICD-10-CM | POA: Diagnosis not present

## 2022-05-14 DIAGNOSIS — R251 Tremor, unspecified: Secondary | ICD-10-CM | POA: Diagnosis not present

## 2022-05-14 DIAGNOSIS — R079 Chest pain, unspecified: Secondary | ICD-10-CM | POA: Diagnosis not present

## 2022-05-14 DIAGNOSIS — R42 Dizziness and giddiness: Secondary | ICD-10-CM | POA: Diagnosis not present

## 2022-05-14 DIAGNOSIS — R000 Tachycardia, unspecified: Secondary | ICD-10-CM | POA: Diagnosis not present

## 2022-05-14 DIAGNOSIS — I4510 Unspecified right bundle-branch block: Secondary | ICD-10-CM | POA: Diagnosis not present

## 2022-05-14 DIAGNOSIS — R110 Nausea: Secondary | ICD-10-CM | POA: Diagnosis not present

## 2022-05-14 DIAGNOSIS — F1290 Cannabis use, unspecified, uncomplicated: Secondary | ICD-10-CM | POA: Diagnosis not present

## 2022-05-14 DIAGNOSIS — R0902 Hypoxemia: Secondary | ICD-10-CM | POA: Diagnosis not present

## 2022-05-14 DIAGNOSIS — R002 Palpitations: Secondary | ICD-10-CM | POA: Diagnosis not present

## 2022-05-14 DIAGNOSIS — R0602 Shortness of breath: Secondary | ICD-10-CM | POA: Diagnosis not present

## 2022-05-14 DIAGNOSIS — R61 Generalized hyperhidrosis: Secondary | ICD-10-CM | POA: Diagnosis not present

## 2022-05-22 ENCOUNTER — Ambulatory Visit (INDEPENDENT_AMBULATORY_CARE_PROVIDER_SITE_OTHER): Payer: BC Managed Care – PPO | Admitting: Nurse Practitioner

## 2022-05-22 ENCOUNTER — Encounter: Payer: Self-pay | Admitting: Nurse Practitioner

## 2022-05-22 VITALS — BP 125/69 | HR 76 | Temp 98.4°F | Resp 16 | Ht 73.0 in | Wt 169.6 lb

## 2022-05-22 DIAGNOSIS — R079 Chest pain, unspecified: Secondary | ICD-10-CM

## 2022-05-22 DIAGNOSIS — E538 Deficiency of other specified B group vitamins: Secondary | ICD-10-CM

## 2022-05-22 MED ORDER — CYANOCOBALAMIN 1000 MCG/ML IJ SOLN
1000.0000 ug | Freq: Once | INTRAMUSCULAR | Status: AC
  Administered 2022-05-22: 1000 ug via INTRAMUSCULAR

## 2022-05-22 NOTE — Progress Notes (Signed)
[facility] [address] [place], [place] [address]  Internal MEDICINE  Office Visit Note  Patient Name: Peter Arias  [id number]  [id number]  Date of Service: 05/22/2022  Chief Complaint  Patient presents with   Follow-up    Follow up ed-chest pain    HPI Peter Arias presents for a follow up visit for an ED visit for chest pain.  EKG done in ED--2x, both showed NSR.  Echo done in ED, was normal except for some valvular regurgitation.  Cardiology on 10/9 in [place] and echo yearly recommended.  --due for B12 injection today   Current Medication: Outpatient Encounter Medications as of 05/22/2022  Medication Sig   ergocalciferol (VITAMIN D2) 1.25 MG (50000 UT) capsule Take 1 capsule (50,000 Units total) by mouth once a week.   [EXPIRED] cyanocobalamin (VITAMIN B12) injection 1,000 mcg    No facility-administered encounter medications on file as of 05/22/2022.    Surgical History: Past Surgical History:  Procedure Laterality Date   achilles tendon rupture      Medical History: History reviewed. No pertinent past medical history.  Family History: Family History  Problem Relation Age of Onset   Heart failure Father     Social History   Socioeconomic History   Marital status: Single    Spouse name: Not on file   Number of children: Not on file   Years of education: Not on file   Highest education level: Not on file  Occupational History   Not on file  Tobacco Use   Smoking status: Never   Smokeless tobacco: Never  Vaping Use   Vaping Use: Never used  Substance and Sexual Activity   Alcohol use: Yes    Alcohol/week: 3.0 standard drinks of alcohol    Types: 3 Shots of liquor per week    Comment: social/ week   Drug use: Yes    Types: Marijuana    Comment: Edibles   Sexual activity: Not on file  Other Topics Concern   Not on file  Social History Narrative   Not on file   Social Determinants of Health   Financial Resource Strain: Not on  file  Food Insecurity: Not on file  Transportation Needs: Not on file  Physical Activity: Not on file  Stress: Not on file  Social Connections: Not on file  Intimate Partner Violence: Not on file      Review of Systems  Constitutional: Negative.  Negative for appetite change and fatigue.  Respiratory: Negative.  Negative for cough, chest tightness, shortness of breath and wheezing.   Cardiovascular:  Positive for palpitations (occasional). Negative for chest pain.    Vital Signs: BP 125/69   Pulse 76   Temp 98.4 F (36.9 C)   Resp 16   Ht 6\' 1"  (1.854 m)   Wt 169 lb 9.6 oz (76.9 kg)   SpO2 98%   BMI 22.38 kg/m    Physical Exam Cardiovascular:     Rate and Rhythm: Normal rate. Occasional Extrasystoles (random, sporadic) are present.    Heart sounds: Murmur heard.  Musculoskeletal:     Right lower leg: No edema.     Left lower leg: No edema.        Assessment/Plan: 1. Chest pain, unspecified type Episodic, workup done in ED in [place]. No red flags noted. Sent home. Has cardiology consult on 10/9 for further evaluation.   2. B12 deficiency Administered in office today - cyanocobalamin (VITAMIN B12) injection 1,000 mcg  General Counseling: Peter Arias verbalizes understanding of the findings of todays visit and agrees with plan of treatment. I have discussed any further diagnostic evaluation that may be needed or ordered today. We also reviewed his medications today. he has been encouraged to call the office with any questions or concerns that should arise related to todays visit.    No orders of the defined types were placed in this encounter.   Meds ordered this encounter  Medications   cyanocobalamin (VITAMIN B12) injection 1,000 mcg    Return for need annual echo and EKG, will be seeing cardiology next week.   Total time spent:20 Minutes Time spent includes review of chart, medications, test results, and follow up plan with the patient.   [place] Controlled  Substance Database was reviewed by me.  This patient was seen by Jonetta Osgood, FNP-C in collaboration with Dr. Clayborn Bigness as a part of collaborative care agreement.   Peter Arias R. Valetta Fuller, MSN, FNP-C Internal medicine

## 2022-05-23 ENCOUNTER — Ambulatory Visit: Payer: BC Managed Care – PPO

## 2022-05-29 DIAGNOSIS — R9431 Abnormal electrocardiogram [ECG] [EKG]: Secondary | ICD-10-CM | POA: Diagnosis not present

## 2022-05-29 DIAGNOSIS — R000 Tachycardia, unspecified: Secondary | ICD-10-CM | POA: Diagnosis not present

## 2022-05-29 DIAGNOSIS — R002 Palpitations: Secondary | ICD-10-CM | POA: Diagnosis not present

## 2022-05-29 DIAGNOSIS — R079 Chest pain, unspecified: Secondary | ICD-10-CM | POA: Diagnosis not present

## 2022-06-08 DIAGNOSIS — R000 Tachycardia, unspecified: Secondary | ICD-10-CM | POA: Diagnosis not present

## 2022-06-08 DIAGNOSIS — R9431 Abnormal electrocardiogram [ECG] [EKG]: Secondary | ICD-10-CM | POA: Diagnosis not present

## 2022-06-08 DIAGNOSIS — R079 Chest pain, unspecified: Secondary | ICD-10-CM | POA: Diagnosis not present

## 2022-06-08 DIAGNOSIS — R002 Palpitations: Secondary | ICD-10-CM | POA: Diagnosis not present

## 2022-06-27 ENCOUNTER — Ambulatory Visit (INDEPENDENT_AMBULATORY_CARE_PROVIDER_SITE_OTHER): Payer: BC Managed Care – PPO

## 2022-06-27 DIAGNOSIS — E538 Deficiency of other specified B group vitamins: Secondary | ICD-10-CM

## 2022-06-27 MED ORDER — CYANOCOBALAMIN 1000 MCG/ML IJ SOLN
1000.0000 ug | Freq: Once | INTRAMUSCULAR | Status: AC
  Administered 2022-06-27: 1000 ug via INTRAMUSCULAR

## 2022-07-02 ENCOUNTER — Encounter: Payer: Self-pay | Admitting: Nurse Practitioner

## 2022-07-12 DIAGNOSIS — R9431 Abnormal electrocardiogram [ECG] [EKG]: Secondary | ICD-10-CM | POA: Diagnosis not present

## 2022-07-12 DIAGNOSIS — R002 Palpitations: Secondary | ICD-10-CM | POA: Diagnosis not present

## 2022-07-25 ENCOUNTER — Ambulatory Visit: Payer: BC Managed Care – PPO

## 2022-08-21 DIAGNOSIS — R079 Chest pain, unspecified: Secondary | ICD-10-CM | POA: Diagnosis not present

## 2022-08-21 DIAGNOSIS — R0789 Other chest pain: Secondary | ICD-10-CM | POA: Diagnosis not present

## 2022-08-21 DIAGNOSIS — I4719 Other supraventricular tachycardia: Secondary | ICD-10-CM | POA: Diagnosis not present

## 2023-01-05 ENCOUNTER — Encounter: Payer: Self-pay | Admitting: Nurse Practitioner

## 2023-01-05 ENCOUNTER — Ambulatory Visit (INDEPENDENT_AMBULATORY_CARE_PROVIDER_SITE_OTHER): Payer: BC Managed Care – PPO | Admitting: Nurse Practitioner

## 2023-01-05 VITALS — BP 130/86 | HR 65 | Temp 98.1°F | Resp 16 | Ht 73.0 in | Wt 176.8 lb

## 2023-01-05 DIAGNOSIS — R300 Dysuria: Secondary | ICD-10-CM

## 2023-01-05 DIAGNOSIS — M172 Bilateral post-traumatic osteoarthritis of knee: Secondary | ICD-10-CM | POA: Diagnosis not present

## 2023-01-05 DIAGNOSIS — D519 Vitamin B12 deficiency anemia, unspecified: Secondary | ICD-10-CM | POA: Diagnosis not present

## 2023-01-05 DIAGNOSIS — E538 Deficiency of other specified B group vitamins: Secondary | ICD-10-CM

## 2023-01-05 DIAGNOSIS — Z0001 Encounter for general adult medical examination with abnormal findings: Secondary | ICD-10-CM

## 2023-01-05 DIAGNOSIS — E782 Mixed hyperlipidemia: Secondary | ICD-10-CM

## 2023-01-05 DIAGNOSIS — E559 Vitamin D deficiency, unspecified: Secondary | ICD-10-CM

## 2023-01-05 DIAGNOSIS — I471 Supraventricular tachycardia, unspecified: Secondary | ICD-10-CM | POA: Diagnosis not present

## 2023-01-05 NOTE — Progress Notes (Signed)
[facility] [address] [place], [place] [address]  Internal MEDICINE  Office Visit Note  Patient Name: Peter Arias  [id number]  [id number]  Date of Service: 01/05/2023  Chief Complaint  Patient presents with   Annual Exam    HPI Peter Arias presents for an annual well visit and physical exam.  Well-appearing 37 y.o. male with vitamin D deficiency, anemia and supraventricular tachycardia.  Labs: routine labs are due now New or worsening pain: none Other concerns: none Had some labs done in the hospital in January, will have labs done that were not done in the hospital.  No significant change in lifestyle, diet or home life.     Current Medication: Outpatient Encounter Medications as of 01/05/2023  Medication Sig   ergocalciferol (VITAMIN D2) 1.25 MG (50000 UT) capsule Take 1 capsule (50,000 Units total) by mouth once a week.   No facility-administered encounter medications on file as of 01/05/2023.    Surgical History: Past Surgical History:  Procedure Laterality Date   achilles tendon rupture      Medical History: History reviewed. No pertinent past medical history.  Family History: Family History  Problem Relation Age of Onset   Heart failure Father     Social History   Socioeconomic History   Marital status: Single    Spouse name: Not on file   Number of children: Not on file   Years of education: Not on file   Highest education level: Not on file  Occupational History   Not on file  Tobacco Use   Smoking status: Never   Smokeless tobacco: Never  Vaping Use   Vaping Use: Never used  Substance and Sexual Activity   Alcohol use: Yes    Alcohol/week: 3.0 standard drinks of alcohol    Types: 3 Shots of liquor per week    Comment: social/ week   Drug use: Yes    Types: Marijuana    Comment: Edibles   Sexual activity: Not on file  Other Topics Concern   Not on file  Social History Narrative   Not on file   Social Determinants of Health    Financial Resource Strain: Not on file  Food Insecurity: Not on file  Transportation Needs: Not on file  Physical Activity: Not on file  Stress: Not on file  Social Connections: Not on file  Intimate Partner Violence: Not on file      Review of Systems  Constitutional:  Negative for activity change, appetite change, chills, fatigue, fever and unexpected weight change.  HENT: Negative.  Negative for congestion, ear pain, rhinorrhea, sore throat and trouble swallowing.   Eyes: Negative.   Respiratory: Negative.  Negative for cough, chest tightness, shortness of breath and wheezing.   Cardiovascular: Negative.  Negative for chest pain and palpitations.  Gastrointestinal: Negative.  Negative for abdominal pain, blood in stool, constipation, diarrhea, nausea and vomiting.  Endocrine: Negative.   Genitourinary: Negative.  Negative for difficulty urinating, dysuria, frequency, hematuria and urgency.  Musculoskeletal: Negative.  Negative for arthralgias, back pain, joint swelling, myalgias and neck pain.  Skin: Negative.  Negative for rash and wound.  Allergic/Immunologic: Negative.  Negative for immunocompromised state.  Neurological: Negative.  Negative for dizziness, seizures, numbness and headaches.  Hematological: Negative.   Psychiatric/Behavioral: Negative.  Negative for behavioral problems, self-injury and suicidal ideas. The patient is not nervous/anxious.     Vital Signs: BP 130/86   Pulse 65   Temp 98.1 F (36.7 C)   Resp  16   Ht 6\' 1"  (1.854 m)   Wt 176 lb 12.8 oz (80.2 kg)   SpO2 98%   BMI 23.33 kg/m    Physical Exam Vitals reviewed.  Constitutional:      General: He is not in acute distress.    Appearance: Normal appearance. He is normal weight. He is not ill-appearing.  HENT:     Head: Normocephalic and atraumatic.     Right Ear: There is impacted cerumen.     Left Ear: Tympanic membrane, ear canal and external ear normal.     Nose: Nose normal. No  congestion or rhinorrhea.     Mouth/Throat:     Mouth: Mucous membranes are moist.     Pharynx: Oropharynx is clear. No oropharyngeal exudate or posterior oropharyngeal erythema.  Eyes:     Extraocular Movements: Extraocular movements intact.     Conjunctiva/sclera: Conjunctivae normal.     Pupils: Pupils are equal, round, and reactive to light.  Cardiovascular:     Rate and Rhythm: Normal rate and regular rhythm.     Pulses: Normal pulses.     Heart sounds: Normal heart sounds. No murmur heard. Pulmonary:     Effort: Pulmonary effort is normal. No respiratory distress.     Breath sounds: Normal breath sounds. No wheezing.  Abdominal:     General: Abdomen is flat.     Palpations: Abdomen is soft.  Musculoskeletal:        General: No swelling or tenderness. Normal range of motion.     Cervical back: Normal range of motion and neck supple.     Right lower leg: No edema.     Left lower leg: No edema.     Comments: Crepitus in both knees   Lymphadenopathy:     Cervical: No cervical adenopathy.  Skin:    General: Skin is warm and dry.     Capillary Refill: Capillary refill takes less than 2 seconds.  Neurological:     General: No focal deficit present.     Mental Status: He is alert and oriented to person, place, and time.     Cranial Nerves: No cranial nerve deficit.     Coordination: Coordination normal.     Gait: Gait normal.  Psychiatric:        Mood and Affect: Mood normal.        Behavior: Behavior normal.        Thought Content: Thought content normal.        Judgment: Judgment normal.        Assessment/Plan: 1. Encounter for routine adult health examination with abnormal findings Age-appropriate preventive screenings and vaccinations discussed, annual physical exam completed. Routine labs for health maintenance ordered, see below. PHM updated.  - B12 and Folate Panel - Lipid Profile - Vitamin D (25 hydroxy)  2. Paroxysmal supraventricular tachycardia No  issues, has not had any more palpitations since he went to the ER in january  3. Vitamin D deficiency Routine lab ordered - Vitamin D (25 hydroxy)  4. Anemia due to vitamin B12 deficiency, unspecified B12 deficiency type Routine labs ordered - B12 and Folate Panel - Vitamin D (25 hydroxy)  5. Mixed hyperlipidemia Routine lab ordered - Lipid Profile  6. B12 deficiency Routine lab ordered - B12 and Folate Panel  7. Dysuria Routine urinalysis done  - UA/M w/rflx Culture, Routine     General Counseling: Taveon verbalizes understanding of the findings of todays visit and agrees with plan of treatment. I  have discussed any further diagnostic evaluation that may be needed or ordered today. We also reviewed his medications today. he has been encouraged to call the office with any questions or concerns that should arise related to todays visit.    Orders Placed This Encounter  Procedures   B12 and Folate Panel   Lipid Profile   Vitamin D (25 hydroxy)   UA/M w/rflx Culture, Routine    No orders of the defined types were placed in this encounter.   Return in about 1 year (around 01/05/2024) for CPE, Jontavius Rabalais PCP and otherwise as needed.   Total time spent:30 Minutes Time spent includes review of chart, medications, test results, and follow up plan with the patient.   [place] Controlled Substance Database was reviewed by me.  This patient was seen by Sallyanne Kuster, FNP-C in collaboration with Dr. Beverely Risen as a part of collaborative care agreement.  Shaquela Weichert R. Tedd Sias, MSN, FNP-C Internal medicine

## 2023-01-06 LAB — UA/M W/RFLX CULTURE, ROUTINE
Bilirubin, UA: NEGATIVE
Glucose, UA: NEGATIVE
Ketones, UA: NEGATIVE
Leukocytes,UA: NEGATIVE
Nitrite, UA: NEGATIVE
Protein,UA: NEGATIVE
RBC, UA: NEGATIVE
Specific Gravity, UA: 1.012 (ref 1.005–1.030)
Urobilinogen, Ur: 0.2 mg/dL (ref 0.2–1.0)
pH, UA: 6.5 (ref 5.0–7.5)

## 2023-01-06 LAB — MICROSCOPIC EXAMINATION
Bacteria, UA: NONE SEEN
Casts: NONE SEEN /lpf
Epithelial Cells (non renal): NONE SEEN /hpf (ref 0–10)
RBC, Urine: NONE SEEN /hpf (ref 0–2)
WBC, UA: NONE SEEN /hpf (ref 0–5)

## 2023-01-06 LAB — B12 AND FOLATE PANEL
Folate: 12.3 ng/mL (ref >3.0)
Vitamin B-12: 345 pg/mL (ref 232–1245)

## 2023-01-06 LAB — LIPID PANEL
Chol/HDL Ratio: 3.7 ratio (ref 0.0–5.0)
Cholesterol, Total: 213 mg/dL — ABNORMAL HIGH (ref 100–199)
HDL: 57 mg/dL (ref >39)
LDL Chol Calc (NIH): 144 mg/dL — ABNORMAL HIGH (ref 0–99)
Triglycerides: 66 mg/dL (ref 0–149)
VLDL Cholesterol Cal: 12 mg/dL (ref 5–40)

## 2023-01-06 LAB — VITAMIN D 25 HYDROXY (VIT D DEFICIENCY, FRACTURES): Vit D, 25-Hydroxy: 36.7 ng/mL (ref 30.0–100.0)

## 2023-01-07 NOTE — Progress Notes (Signed)
Please let the patient know that his labs are normal except for his cholesterol panel but his cholesterol panel is improving when compared to last year.  My only recommendation is to continue to limit red meat, eat lean proteins such as [place], chicken and fish and stay active. We will repeat labs with his physical next year.

## 2023-01-09 ENCOUNTER — Telehealth: Payer: Self-pay

## 2023-01-09 NOTE — Telephone Encounter (Signed)
Patient notified

## 2023-01-09 NOTE — Telephone Encounter (Signed)
-----   Message from Sallyanne Kuster, NP sent at 01/07/2023  8:00 AM EDT ----- Please let the patient know that his labs are normal except for his cholesterol panel but his cholesterol panel is improving when compared to last year.  My only recommendation is to continue to limit red meat, eat lean proteins such as [place], chicken and fish and stay active. We will repeat labs with his physical next year.

## 2023-02-26 DIAGNOSIS — J029 Acute pharyngitis, unspecified: Secondary | ICD-10-CM | POA: Diagnosis not present

## 2023-03-20 DIAGNOSIS — G4700 Insomnia, unspecified: Secondary | ICD-10-CM | POA: Diagnosis not present

## 2023-03-20 DIAGNOSIS — I4510 Unspecified right bundle-branch block: Secondary | ICD-10-CM | POA: Diagnosis not present

## 2023-03-20 DIAGNOSIS — R000 Tachycardia, unspecified: Secondary | ICD-10-CM | POA: Diagnosis not present

## 2023-03-20 DIAGNOSIS — Z8241 Family history of sudden cardiac death: Secondary | ICD-10-CM | POA: Diagnosis not present

## 2023-05-11 DIAGNOSIS — S93521A Sprain of metatarsophalangeal joint of right great toe, initial encounter: Secondary | ICD-10-CM | POA: Diagnosis not present

## 2023-06-18 DIAGNOSIS — R000 Tachycardia, unspecified: Secondary | ICD-10-CM | POA: Diagnosis not present

## 2023-06-18 DIAGNOSIS — Z8241 Family history of sudden cardiac death: Secondary | ICD-10-CM | POA: Diagnosis not present

## 2023-06-18 DIAGNOSIS — G4700 Insomnia, unspecified: Secondary | ICD-10-CM | POA: Diagnosis not present

## 2023-06-18 DIAGNOSIS — I4510 Unspecified right bundle-branch block: Secondary | ICD-10-CM | POA: Diagnosis not present

## 2023-06-18 DIAGNOSIS — E559 Vitamin D deficiency, unspecified: Secondary | ICD-10-CM | POA: Diagnosis not present

## 2023-08-07 DIAGNOSIS — R1084 Generalized abdominal pain: Secondary | ICD-10-CM | POA: Diagnosis not present

## 2024-01-07 ENCOUNTER — Encounter: Payer: BC Managed Care – PPO | Admitting: Nurse Practitioner
